# Patient Record
Sex: Male | Born: 1965 | Race: White | Hispanic: No | Marital: Married | Smoking: Never smoker
Health system: Southern US, Community
[De-identification: ages and names within clinical notes are randomized; demographics above are authoritative.]

## PROBLEM LIST (undated history)

## (undated) DIAGNOSIS — K5720 Diverticulitis of large intestine with perforation and abscess without bleeding: Secondary | ICD-10-CM

## (undated) DIAGNOSIS — J302 Other seasonal allergic rhinitis: Secondary | ICD-10-CM

## (undated) DIAGNOSIS — N189 Chronic kidney disease, unspecified: Secondary | ICD-10-CM

## (undated) DIAGNOSIS — R7303 Prediabetes: Secondary | ICD-10-CM

## (undated) DIAGNOSIS — G4730 Sleep apnea, unspecified: Secondary | ICD-10-CM

## (undated) DIAGNOSIS — K219 Gastro-esophageal reflux disease without esophagitis: Secondary | ICD-10-CM

## (undated) DIAGNOSIS — J309 Allergic rhinitis, unspecified: Secondary | ICD-10-CM

## (undated) HISTORY — PX: COLONOSCOPY: SHX174

## (undated) HISTORY — DX: Diverticulitis of large intestine with perforation and abscess without bleeding: K57.20

## (undated) HISTORY — PX: VASECTOMY: SHX75

## (undated) HISTORY — PX: NOSE SURGERY: SHX723

## (undated) HISTORY — DX: Allergic rhinitis, unspecified: J30.9

## (undated) HISTORY — DX: Chronic kidney disease, unspecified: N18.9

## (undated) HISTORY — PX: ELBOW SURGERY: SHX618

## (undated) HISTORY — PX: COLONOSCOPY W/ POLYPECTOMY: SHX1380

---

## 2013-01-16 ENCOUNTER — Encounter (HOSPITAL_COMMUNITY): Admission: EM | Disposition: A | Discharge status: Home Health | Payer: Self-pay | Source: Home / Self Care

## 2013-01-16 ENCOUNTER — Emergency Department (HOSPITAL_COMMUNITY): Payer: BC Managed Care – PPO | Admitting: Anesthesiology

## 2013-01-16 ENCOUNTER — Encounter (HOSPITAL_COMMUNITY): Payer: Self-pay | Admitting: Emergency Medicine

## 2013-01-16 ENCOUNTER — Encounter (HOSPITAL_COMMUNITY): Payer: Self-pay | Admitting: Anesthesiology

## 2013-01-16 ENCOUNTER — Emergency Department (HOSPITAL_COMMUNITY): Payer: BC Managed Care – PPO

## 2013-01-16 ENCOUNTER — Inpatient Hospital Stay (HOSPITAL_COMMUNITY)
Admission: EM | Admit: 2013-01-16 | Discharge: 2013-01-23 | DRG: 585 | Disposition: A | Discharge status: Home Health | Payer: BC Managed Care – PPO | Attending: General Surgery | Admitting: General Surgery

## 2013-01-16 DIAGNOSIS — K5732 Diverticulitis of large intestine without perforation or abscess without bleeding: Principal | ICD-10-CM | POA: Diagnosis present

## 2013-01-16 DIAGNOSIS — K659 Peritonitis, unspecified: Secondary | ICD-10-CM | POA: Diagnosis present

## 2013-01-16 DIAGNOSIS — K668 Other specified disorders of peritoneum: Secondary | ICD-10-CM

## 2013-01-16 DIAGNOSIS — K5720 Diverticulitis of large intestine with perforation and abscess without bleeding: Secondary | ICD-10-CM

## 2013-01-16 HISTORY — PX: COLOSTOMY: SHX63

## 2013-01-16 HISTORY — PX: LAPAROTOMY: SHX154

## 2013-01-16 HISTORY — DX: Diverticulitis of large intestine with perforation and abscess without bleeding: K57.20

## 2013-01-16 LAB — CBC WITH DIFFERENTIAL/PLATELET
Basophils Absolute: 0 10*3/uL (ref 0.0–0.1)
Basophils Relative: 0 % (ref 0–1)
Eosinophils Absolute: 0.1 10*3/uL (ref 0.0–0.7)
Eosinophils Relative: 1 % (ref 0–5)
HCT: 44.7 % (ref 39.0–52.0)
Hemoglobin: 14.8 g/dL (ref 13.0–17.0)
Lymphocytes Relative: 10 % — ABNORMAL LOW (ref 12–46)
Lymphs Abs: 1.3 10*3/uL (ref 0.7–4.0)
MCH: 26 pg (ref 26.0–34.0)
MCHC: 33.1 g/dL (ref 30.0–36.0)
MCV: 78.4 fL (ref 78.0–100.0)
Monocytes Absolute: 1.1 10*3/uL — ABNORMAL HIGH (ref 0.1–1.0)
Monocytes Relative: 8 % (ref 3–12)
Neutro Abs: 10.8 10*3/uL — ABNORMAL HIGH (ref 1.7–7.7)
Neutrophils Relative %: 81 % — ABNORMAL HIGH (ref 43–77)
Platelets: 183 10*3/uL (ref 150–400)
RBC: 5.7 MIL/uL (ref 4.22–5.81)
RDW: 14.7 % (ref 11.5–15.5)
WBC: 13.3 10*3/uL — ABNORMAL HIGH (ref 4.0–10.5)

## 2013-01-16 LAB — URINALYSIS, ROUTINE W REFLEX MICROSCOPIC
Bilirubin Urine: NEGATIVE
Glucose, UA: NEGATIVE mg/dL
Hgb urine dipstick: NEGATIVE
Ketones, ur: NEGATIVE mg/dL
Leukocytes, UA: NEGATIVE
Nitrite: NEGATIVE
Protein, ur: NEGATIVE mg/dL
Specific Gravity, Urine: 1.016 (ref 1.005–1.030)
Urobilinogen, UA: 0.2 mg/dL (ref 0.0–1.0)
pH: 7.5 (ref 5.0–8.0)

## 2013-01-16 LAB — COMPREHENSIVE METABOLIC PANEL
ALT: 74 U/L — ABNORMAL HIGH (ref 0–53)
AST: 54 U/L — ABNORMAL HIGH (ref 0–37)
Albumin: 3.9 g/dL (ref 3.5–5.2)
Alkaline Phosphatase: 84 U/L (ref 39–117)
BUN: 11 mg/dL (ref 6–23)
CO2: 22 mEq/L (ref 19–32)
Calcium: 8.8 mg/dL (ref 8.4–10.5)
Chloride: 99 mEq/L (ref 96–112)
Creatinine, Ser: 0.91 mg/dL (ref 0.50–1.35)
GFR calc Af Amer: >90 mL/min (ref >90)
GFR calc non Af Amer: >90 mL/min (ref >90)
Glucose, Bld: 100 mg/dL — ABNORMAL HIGH (ref 70–99)
Potassium: 4.3 mEq/L (ref 3.5–5.1)
Sodium: 134 mEq/L — ABNORMAL LOW (ref 135–145)
Total Bilirubin: 0.5 mg/dL (ref 0.3–1.2)
Total Protein: 7.6 g/dL (ref 6.0–8.3)

## 2013-01-16 LAB — LIPASE, BLOOD: Lipase: 59 U/L (ref 11–59)

## 2013-01-16 SURGERY — LAPAROTOMY, EXPLORATORY
Anesthesia: General | Site: Abdomen | Wound class: Dirty or Infected

## 2013-01-16 MED ORDER — LIDOCAINE HCL (CARDIAC) 20 MG/ML IV SOLN
INTRAVENOUS | Status: DC | PRN
  Administered 2013-01-16: 40 mg via INTRAVENOUS

## 2013-01-16 MED ORDER — PROPOFOL 10 MG/ML IV BOLUS
INTRAVENOUS | Status: DC | PRN
  Administered 2013-01-16: 200 mg via INTRAVENOUS
  Administered 2013-01-17: 50 mg via INTRAVENOUS
  Administered 2013-01-17 (×2): 30 mg via INTRAVENOUS

## 2013-01-16 MED ORDER — SUCCINYLCHOLINE CHLORIDE 20 MG/ML IJ SOLN
INTRAMUSCULAR | Status: DC | PRN
  Administered 2013-01-16: 100 mg via INTRAVENOUS

## 2013-01-16 MED ORDER — CIPROFLOXACIN IN D5W 400 MG/200ML IV SOLN: INTRAVENOUS | Status: DC | PRN

## 2013-01-16 MED ORDER — FENTANYL CITRATE 0.05 MG/ML IJ SOLN
50.0000 ug | Freq: Once | INTRAMUSCULAR | Status: AC
  Administered 2013-01-16: 50 ug via INTRAVENOUS
  Filled 2013-01-16: qty 2

## 2013-01-16 MED ORDER — 0.9 % SODIUM CHLORIDE (POUR BTL) OPTIME
TOPICAL | Status: DC | PRN
  Administered 2013-01-16: 5000 mL

## 2013-01-16 MED ORDER — SODIUM CHLORIDE 0.9 % IV BOLUS (SEPSIS)
1000.0000 mL | Freq: Once | INTRAVENOUS | Status: AC
  Administered 2013-01-16: 1000 mL via INTRAVENOUS

## 2013-01-16 MED ORDER — ONDANSETRON HCL 4 MG/2ML IJ SOLN
4.0000 mg | Freq: Once | INTRAMUSCULAR | Status: AC
  Administered 2013-01-16: 4 mg via INTRAVENOUS
  Filled 2013-01-16: qty 2

## 2013-01-16 MED ORDER — METRONIDAZOLE IN NACL 5-0.79 MG/ML-% IV SOLN
500.0000 mg | Freq: Once | INTRAVENOUS | Status: AC
  Administered 2013-01-16: 500 mg via INTRAVENOUS
  Filled 2013-01-16: qty 100

## 2013-01-16 MED ORDER — HYDROMORPHONE HCL PF 1 MG/ML IJ SOLN
1.0000 mg | Freq: Once | INTRAMUSCULAR | Status: AC
  Administered 2013-01-16: 1 mg via INTRAVENOUS
  Filled 2013-01-16: qty 1

## 2013-01-16 MED ORDER — IOHEXOL 300 MG/ML  SOLN
100.0000 mL | Freq: Once | INTRAMUSCULAR | Status: AC | PRN
  Administered 2013-01-16: 100 mL via INTRAVENOUS

## 2013-01-16 MED ORDER — SUFENTANIL CITRATE 50 MCG/ML IV SOLN
INTRAVENOUS | Status: DC | PRN
  Administered 2013-01-16 (×2): 10 ug via INTRAVENOUS
  Administered 2013-01-17: 5 ug via INTRAVENOUS
  Administered 2013-01-17 (×2): 10 ug via INTRAVENOUS
  Administered 2013-01-17 (×2): 5 ug via INTRAVENOUS
  Administered 2013-01-17: 10 ug via INTRAVENOUS

## 2013-01-16 MED ORDER — DEXAMETHASONE SODIUM PHOSPHATE 10 MG/ML IJ SOLN
INTRAMUSCULAR | Status: DC | PRN
  Administered 2013-01-16: 10 mg via INTRAVENOUS

## 2013-01-16 MED ORDER — LACTATED RINGERS IV SOLN
INTRAVENOUS | Status: DC | PRN
  Administered 2013-01-16 – 2013-01-17 (×4): via INTRAVENOUS

## 2013-01-16 MED ORDER — PHENYLEPHRINE HCL 10 MG/ML IJ SOLN
INTRAMUSCULAR | Status: DC | PRN
  Administered 2013-01-16 (×3): 80 ug via INTRAVENOUS

## 2013-01-16 MED ORDER — CIPROFLOXACIN IN D5W 400 MG/200ML IV SOLN
400.0000 mg | Freq: Once | INTRAVENOUS | Status: AC
Start: 2013-01-16 — End: 2013-01-16
  Administered 2013-01-16: 400 mg via INTRAVENOUS
  Filled 2013-01-16: qty 200

## 2013-01-16 MED ORDER — IOHEXOL 300 MG/ML  SOLN: 50.0000 mL | Freq: Once | INTRAMUSCULAR | Status: DC | PRN

## 2013-01-16 SURGICAL SUPPLY — 60 items
APL SKNCLS STERI-STRIP NONHPOA (GAUZE/BANDAGES/DRESSINGS) ×2
APPLICATOR COTTON TIP 6IN STRL (MISCELLANEOUS) ×4 IMPLANT
BANDAGE GAUZE ELAST BULKY 4 IN (GAUZE/BANDAGES/DRESSINGS) ×1 IMPLANT
BENZOIN TINCTURE PRP APPL 2/3 (GAUZE/BANDAGES/DRESSINGS) ×1 IMPLANT
BLADE EXTENDED COATED 6.5IN (ELECTRODE) ×1 IMPLANT
BLADE HEX COATED 2.75 (ELECTRODE) ×2 IMPLANT
BRR ADH 5X3 SEPRAFILM 6 SHT (MISCELLANEOUS) ×2
CANISTER SUCTION 2500CC (MISCELLANEOUS) ×2 IMPLANT
CHLORAPREP W/TINT 26ML (MISCELLANEOUS) ×3 IMPLANT
CLAMP POUCH DRAINAGE QUIET (OSTOMY) ×1 IMPLANT
CLOTH BEACON ORANGE TIMEOUT ST (SAFETY) ×3 IMPLANT
COVER MAYO STAND STRL (DRAPES) ×1 IMPLANT
DRAPE LAPAROSCOPIC ABDOMINAL (DRAPES) ×3 IMPLANT
DRAPE WARM FLUID 44X44 (DRAPE) ×1 IMPLANT
DRSG PAD ABDOMINAL 8X10 ST (GAUZE/BANDAGES/DRESSINGS) ×2 IMPLANT
ELECT REM PT RETURN 9FT ADLT (ELECTROSURGICAL) ×3
ELECTRODE REM PT RTRN 9FT ADLT (ELECTROSURGICAL) ×2 IMPLANT
GLOVE BIO SURGEON STRL SZ7 (GLOVE) ×2 IMPLANT
GLOVE BIOGEL M STRL SZ7.5 (GLOVE) ×2 IMPLANT
GLOVE BIOGEL PI IND STRL 7.0 (GLOVE) ×2 IMPLANT
GLOVE BIOGEL PI IND STRL 8 (GLOVE) IMPLANT
GLOVE BIOGEL PI INDICATOR 7.0 (GLOVE) ×1
GLOVE BIOGEL PI INDICATOR 8 (GLOVE) ×1
GLOVE SURG SS PI 7.5 STRL IVOR (GLOVE) ×6 IMPLANT
GLOVE SURG SS PI 8.5 STRL IVOR (GLOVE) ×3
GLOVE SURG SS PI 8.5 STRL STRW (GLOVE) IMPLANT
GOWN BRE IMP PREV XXLGXLNG (GOWN DISPOSABLE) ×1 IMPLANT
GOWN PREVENTION PLUS LG XLONG (DISPOSABLE) ×2 IMPLANT
GOWN STRL NON-REIN LRG LVL3 (GOWN DISPOSABLE) ×2 IMPLANT
GOWN STRL REIN XL XLG (GOWN DISPOSABLE) ×6 IMPLANT
KIT BASIN OR (CUSTOM PROCEDURE TRAY) ×3 IMPLANT
LIGASURE IMPACT 36 18CM CVD LR (INSTRUMENTS) ×1 IMPLANT
MANIFOLD NEPTUNE II (INSTRUMENTS) ×1 IMPLANT
NS IRRIG 1000ML POUR BTL (IV SOLUTION) ×9 IMPLANT
PACK GENERAL/GYN (CUSTOM PROCEDURE TRAY) ×3 IMPLANT
POUCH DRAINABLE 1PC 2 1/4 FLAT (OSTOMY) ×1 IMPLANT
SCALPEL HARMONIC ACE (MISCELLANEOUS) IMPLANT
SEPRAFILM PROCEDURAL PACK 3X5 (MISCELLANEOUS) ×1 IMPLANT
SHEARS FOC LG CVD HARMONIC 17C (MISCELLANEOUS) IMPLANT
SLEEVE SURGEON STRL (DRAPES) ×2 IMPLANT
SPONGE GAUZE 4X4 12PLY (GAUZE/BANDAGES/DRESSINGS) ×3 IMPLANT
SPONGE LAP 18X18 X RAY DECT (DISPOSABLE) ×3 IMPLANT
STAPLER CUT CVD 40MM GREEN (STAPLE) ×1 IMPLANT
STAPLER CUT RELOAD GREEN (STAPLE) ×1 IMPLANT
STAPLER VISISTAT 35W (STAPLE) ×3 IMPLANT
SUCTION POOLE TIP (SUCTIONS) ×1 IMPLANT
SUT PDS AB 1 CTX 36 (SUTURE) IMPLANT
SUT PDS AB 1 TP1 96 (SUTURE) ×2 IMPLANT
SUT PROLENE 2 0 SH DA (SUTURE) ×1 IMPLANT
SUT SILK 2 0 (SUTURE) ×3
SUT SILK 2 0 SH CR/8 (SUTURE) ×1 IMPLANT
SUT SILK 2-0 18XBRD TIE 12 (SUTURE) ×2 IMPLANT
SUT SILK 3 0 (SUTURE) ×3
SUT SILK 3 0 SH CR/8 (SUTURE) ×1 IMPLANT
SUT SILK 3-0 18XBRD TIE 12 (SUTURE) IMPLANT
SUT VIC AB 2-0 SH 18 (SUTURE) ×2 IMPLANT
TAPE CLOTH SURG 4X10 WHT LF (GAUZE/BANDAGES/DRESSINGS) ×1 IMPLANT
TOWEL OR 17X26 10 PK STRL BLUE (TOWEL DISPOSABLE) ×3 IMPLANT
TRAY FOLEY CATH 14FRSI W/METER (CATHETERS) ×1 IMPLANT
YANKAUER SUCT BULB TIP NO VENT (SUCTIONS) ×1 IMPLANT

## 2013-01-16 NOTE — ED Provider Notes (Signed)
History     CSN: [id number]  Arrival date & time 01/16/13  1657   First MD Initiated Contact with Patient 01/16/13 1818      Chief Complaint  Patient presents with  . Abdominal Pain    (Consider location/radiation/quality/duration/timing/severity/associated sxs/prior treatment) HPI Patient presents to the emergency department with sudden onset, abdominal pain, around 3:00 this afternoon.  Patient, states, that the pain is intense.  Patient denies vomiting, nausea, chest pain, shortness of breath, weakness, back pain, headache, blurred vision, dizziness, syncope, or fever.  Patient, states he did not take anything prior to arrival for his symptoms.  Palpation and movement make the pain, worse. No past medical history on file.  No past surgical history on file.  No family history on file.  History  Substance Use Topics  . Smoking status: Never Smoker   . Smokeless tobacco: Never Used  . Alcohol Use: Yes     Comment: ocassional beer/wine      Review of Systems All other systems negative except as documented in the HPI. All pertinent positives and negatives as reviewed in the HPI. Allergies  Erythromycin  Home Medications   Current Outpatient Rx  Name  Route  Sig  Dispense  Refill  . calcium carbonate (TUMS - DOSED IN MG ELEMENTAL CALCIUM) 500 MG chewable tablet   Oral   Chew 2 tablets by mouth 4 (four) times daily as needed for heartburn.         . mometasone (NASONEX) 50 MCG/ACT nasal spray   Nasal   Place 2 sprays into the nose daily.           BP 134/88  Pulse 86  Temp(Src) 98.7 F (37.1 C) (Oral)  Resp 18  SpO2 100%  Physical Exam  Constitutional: He appears well-developed and well-nourished. No distress.  HENT:  Head: Normocephalic and atraumatic.  Mouth/Throat: Oropharynx is clear and moist.  Cardiovascular: Normal rate, regular rhythm and normal heart sounds.  Exam reveals no gallop and no friction rub.   No murmur heard. Abdominal: Normal  appearance and bowel sounds are normal. There is tenderness. There is guarding.      ED Course  Procedures (including critical care time)  Labs Reviewed  CBC WITH DIFFERENTIAL - Abnormal; Notable for the following:    WBC 13.3 (*)    Neutrophils Relative 81 (*)    Lymphocytes Relative 10 (*)    Neutro Abs 10.8 (*)    Monocytes Absolute 1.1 (*)    All other components within normal limits  COMPREHENSIVE METABOLIC PANEL - Abnormal; Notable for the following:    Sodium 134 (*)    Glucose, Bld 100 (*)    AST 54 (*)    ALT 74 (*)    All other components within normal limits  URINALYSIS, ROUTINE W REFLEX MICROSCOPIC  LIPASE, BLOOD    Pain was sudden onset so initially concerned about a kidney stone but patient has not hematuria. The patient will get CT scan.  8:15 PM Called by radiology about the patient having free air and fluid. The patient remains stable. Feeling better with Dilaudid. The patient is given the results.   Spoke with Surgery and they will be in to see the patient.  MDM  MDM Reviewed: vitals and nursing note Interpretation: labs and CT scan Consults: general surgery            Carlyle Dolly, PA-C 01/16/13 2059

## 2013-01-16 NOTE — Anesthesia Preprocedure Evaluation (Addendum)
Anesthesia Evaluation  Patient identified by MRN, date of birth, ID band Patient awake    Reviewed: Allergy & Precautions, H&P , NPO status , Patient's Chart, lab work & pertinent test results  Airway Mallampati: IV TM Distance: >3 FB Neck ROM: Full  Mouth opening: Limited Mouth Opening  Dental  (+) Dental Advisory Given and Teeth Intact   Pulmonary neg pulmonary ROS,  breath sounds clear to auscultation        Cardiovascular Exercise Tolerance: Good - CAD and - Past MI negative cardio ROS  Rhythm:Regular Rate:Tachycardia     Neuro/Psych negative neurological ROS  negative psych ROS   GI/Hepatic negative GI ROS, Neg liver ROS,   Endo/Other  negative endocrine ROS  Renal/GU negative Renal ROS     Musculoskeletal negative musculoskeletal ROS (+)   Abdominal   Peds  Hematology negative hematology ROS (+)   Anesthesia Other Findings   Reproductive/Obstetrics                         Anesthesia Physical Anesthesia Plan  ASA: II and emergent  Anesthesia Plan: General   Post-op Pain Management:    Induction: Intravenous, Rapid sequence and Cricoid pressure planned  Airway Management Planned: Oral ETT and Video Laryngoscope Planned  Additional Equipment:   Intra-op Plan:   Post-operative Plan: Possible Post-op intubation/ventilation  Informed Consent: I have reviewed the patients History and Physical, chart, labs and discussed the procedure including the risks, benefits and alternatives for the proposed anesthesia with the patient or authorized representative who has indicated his/her understanding and acceptance.   Dental advisory given  Plan Discussed with: CRNA  Anesthesia Plan Comments:        Anesthesia Quick Evaluation

## 2013-01-16 NOTE — ED Notes (Signed)
Onset of acute RLQ pain at 1500 this afternoon. Started abruptly, denies N/V/D. Pt positive for rebound tenderness.

## 2013-01-16 NOTE — H&P (Signed)
Reason for Consult:diverticulitis Referring Physician: Deretha Emory PA  Dudley Major. is an 47 y.o. male.  HPI: was asked to evaluate this patient for perforated diverticulitis. This patient is normally in good health and woke up feeling fine this morning. After eating lunch today he began feeling as though he needed further restroom and was feeling "gassy". He was having some bloating and some lower abdominal pain across his belt line. He had a bowel movement which he says was normal without any blood or melena without any significant relief he said that the pain continued to increase mainly in the right lower quadrant but he was feeling this across his belt to the left lower quadrant and diffusely. He had some fever this afternoon when he checked into the emergency room but that has not had any fevers or chills. He does have frequent heartburn and takes Pepcid over-the-counter and usually takes one to 3 times per day. He says that he has never had a colonoscopy and has never had any similar episodes prior to this.  No past medical history on file.  No past surgical history on file.  No family history on file.  Social History:  reports that he has never smoked. He has never used smokeless tobacco. He reports that  drinks alcohol. He reports that he does not use illicit drugs.  Allergies:  Allergies  Allergen Reactions  . Erythromycin Other (See Comments)    Stomach upset    Medications: I have reviewed the patient's current medications.  Results for orders placed during the hospital encounter of 01/16/13 (from the past 48 hour(s))  CBC WITH DIFFERENTIAL     Status: Abnormal   Collection Time    01/16/13  6:00 PM      Result Value Range   WBC 13.3 (*) 4.0 - 10.5 K/uL   RBC 5.70  4.22 - 5.81 MIL/uL   Hemoglobin 14.8  13.0 - 17.0 g/dL   HCT 82.9  56.2 - 13.0 %   MCV 78.4  78.0 - 100.0 fL   MCH 26.0  26.0 - 34.0 pg   MCHC 33.1  30.0 - 36.0 g/dL   RDW 86.5  78.4 - 69.6 %    Platelets 183  150 - 400 K/uL   Comment: SPECIMEN CHECKED FOR CLOTS     REPEATED TO VERIFY     PLATELET COUNT CONFIRMED BY SMEAR   Neutrophils Relative 81 (*) 43 - 77 %   Lymphocytes Relative 10 (*) 12 - 46 %   Monocytes Relative 8  3 - 12 %   Eosinophils Relative 1  0 - 5 %   Basophils Relative 0  0 - 1 %   Neutro Abs 10.8 (*) 1.7 - 7.7 K/uL   Lymphs Abs 1.3  0.7 - 4.0 K/uL   Monocytes Absolute 1.1 (*) 0.1 - 1.0 K/uL   Eosinophils Absolute 0.1  0.0 - 0.7 K/uL   Basophils Absolute 0.0  0.0 - 0.1 K/uL   Smear Review LARGE PLATELETS PRESENT     Comment: PLATELET COUNT CONFIRMED BY SMEAR  COMPREHENSIVE METABOLIC PANEL     Status: Abnormal   Collection Time    01/16/13  6:00 PM      Result Value Range   Sodium 134 (*) 135 - 145 mEq/L   Potassium 4.3  3.5 - 5.1 mEq/L   Comment: MODERATE HEMOLYSIS     HEMOLYSIS AT THIS LEVEL MAY AFFECT RESULT   Chloride 99  96 - 112 mEq/L  CO2 22  19 - 32 mEq/L   Glucose, Bld 100 (*) 70 - 99 mg/dL   BUN 11  6 - 23 mg/dL   Creatinine, Ser 8.11  0.50 - 1.35 mg/dL   Calcium 8.8  8.4 - 91.4 mg/dL   Total Protein 7.6  6.0 - 8.3 g/dL   Albumin 3.9  3.5 - 5.2 g/dL   AST 54 (*) 0 - 37 U/L   ALT 74 (*) 0 - 53 U/L   Alkaline Phosphatase 84  39 - 117 U/L   Comment: MODERATE HEMOLYSIS     HEMOLYSIS AT THIS LEVEL MAY AFFECT RESULT   Total Bilirubin 0.5  0.3 - 1.2 mg/dL   GFR calc non Af Amer >90  >90 mL/min   GFR calc Af Amer >90  >90 mL/min   Comment:            The eGFR has been calculated     using the CKD EPI equation.     This calculation has not been     validated in all clinical     situations.     eGFR's persistently     <90 mL/min signify     possible Chronic Kidney Disease.  URINALYSIS, ROUTINE W REFLEX MICROSCOPIC     Status: None   Collection Time    01/16/13  6:18 PM      Result Value Range   Color, Urine YELLOW  YELLOW   APPearance CLEAR  CLEAR   Specific Gravity, Urine 1.016  1.005 - 1.030   pH 7.5  5.0 - 8.0   Glucose, UA  NEGATIVE  NEGATIVE mg/dL   Hgb urine dipstick NEGATIVE  NEGATIVE   Bilirubin Urine NEGATIVE  NEGATIVE   Ketones, ur NEGATIVE  NEGATIVE mg/dL   Protein, ur NEGATIVE  NEGATIVE mg/dL   Urobilinogen, UA 0.2  0.0 - 1.0 mg/dL   Nitrite NEGATIVE  NEGATIVE   Leukocytes, UA NEGATIVE  NEGATIVE   Comment: MICROSCOPIC NOT DONE ON URINES WITH NEGATIVE PROTEIN, BLOOD, LEUKOCYTES, NITRITE, OR GLUCOSE <1000 mg/dL.  LIPASE, BLOOD     Status: None   Collection Time    01/16/13  6:30 PM      Result Value Range   Lipase 59  11 - 59 U/L    Ct Abdomen Pelvis W Contrast  01/16/2013  *RADIOLOGY REPORT*  Clinical Data: Right lower quadrant pain and fever  CT ABDOMEN AND PELVIS WITH CONTRAST  Technique:  Multidetector CT imaging of the abdomen and pelvis was performed following the standard protocol during bolus administration of intravenous contrast.  Contrast: OMNIPAQUE IOHEXOL 300 MG/ML  SOLN  Comparison: None.  Findings: Lung bases are clear.  No pleural or pericardial fluid. There is fatty change of the liver.  No focal lesion.  No calcified gallstones.  The spleen is normal.  The pancreas is normal.  The adrenal glands are normal.  The kidneys are normal.  The aorta and IVC are normal.  There is diverticulosis of the left colon.  There is diverticulitis in the sigmoid region with bowel wall thickening and pericolic stranding.  There is a small amount of free fluid and free air.  No discernible actual abscess.  No bony abnormality.  The appendix is normal.  IMPRESSION: Acute sigmoid diverticulitis with surrounding phlegmonous inflammation and some free fluid and air.  No focal abscess at this moment.  Critical Value/emergent results were called by telephone at the time of interpretation on 01/16/2013 at 2040 hours to physician's  assistant Ebbie Ridge, who verbally acknowledged these results.   Original Report Authenticated By: Paulina Fusi, M.D.     All other review of systems negative or noncontributory except  as stated in the HPI   Blood pressure 134/88, pulse 86, temperature 98.7 F (37.1 C), temperature source Oral, resp. rate 18, SpO2 100.00%. General appearance: alert, cooperative and no distress Head: Normocephalic, without obvious abnormality, atraumatic Eyes: negative Neck: no JVD and supple, symmetrical, trachea midline Resp: nonlabored, no wheeze Chest wall: no tenderness Cardio: normal rate, regular GI: soft, diffusely tender with some guarding, tenderness greatest across the lower abdomen and RLQ Extremities: extremities normal, atraumatic, no cyanosis or edema Pulses: 2+ and symmetric Skin: Skin color, texture, turgor normal. No rashes or lesions Neurologic: Grossly normal  Assessment/Plan: Diverticulitis with perforation This patient has peritonitis on exam with evidence of free air in the sore perforation on CT scan. His white count is 13 as this is most likely consistent with a perforated diverticulitis. However, he does have heartburn and takes TUMS and Pepcid frequently if this could be peptic ulcer disease as well. I discussed with him the options for antibiotic management with bowel rest and nonoperative treatment versus laparoscopic washout and drainage versus exporter laparotomy with sigmoid colectomy and colostomy and I have recommended exploratory laparotomy with likely sigmoid colectomy and likely colostomy. I did explain that this could be other causes as well as which might require different procedure. I discussed with him the possibilities of having an open wound and meeting wound care as well as ostomy care and the need for second surgery to reverse his ostomy. We also discussed the possibility of oh anastomosis if the intestine looks healthy and with the use of risk of possible anastomotic leakage and the need for future ostomy in this case. We will go ahead and proceed with exploratory laparotomy.  Lodema Pilot DAVID 01/16/2013, 10:11 PM

## 2013-01-16 NOTE — ED Provider Notes (Signed)
Patient with right-sided abdominal pain onset approximately 1 AM today. Progressively worsening.. She feels improved since treatment in the emergency department. Plan surgical consult antibiotics  Doug Sou, MD 01/17/13 0100

## 2013-01-17 ENCOUNTER — Encounter (HOSPITAL_COMMUNITY): Payer: Self-pay | Admitting: *Deleted

## 2013-01-17 DIAGNOSIS — K631 Perforation of intestine (nontraumatic): Secondary | ICD-10-CM

## 2013-01-17 DIAGNOSIS — K5732 Diverticulitis of large intestine without perforation or abscess without bleeding: Secondary | ICD-10-CM

## 2013-01-17 LAB — CBC WITH DIFFERENTIAL/PLATELET
Basophils Absolute: 0 10*3/uL (ref 0.0–0.1)
Basophils Relative: 0 % (ref 0–1)
Eosinophils Absolute: 0 10*3/uL (ref 0.0–0.7)
Eosinophils Relative: 0 % (ref 0–5)
HCT: 37.8 % — ABNORMAL LOW (ref 39.0–52.0)
Hemoglobin: 12.2 g/dL — ABNORMAL LOW (ref 13.0–17.0)
Lymphocytes Relative: 2 % — ABNORMAL LOW (ref 12–46)
Lymphs Abs: 0.3 10*3/uL — ABNORMAL LOW (ref 0.7–4.0)
MCH: 25.4 pg — ABNORMAL LOW (ref 26.0–34.0)
MCHC: 32.3 g/dL (ref 30.0–36.0)
MCV: 78.8 fL (ref 78.0–100.0)
Monocytes Absolute: 0.7 10*3/uL (ref 0.1–1.0)
Monocytes Relative: 5 % (ref 3–12)
Neutro Abs: 13.1 10*3/uL — ABNORMAL HIGH (ref 1.7–7.7)
Neutrophils Relative %: 92 % — ABNORMAL HIGH (ref 43–77)
Platelets: 155 10*3/uL (ref 150–400)
RBC: 4.8 MIL/uL (ref 4.22–5.81)
RDW: 15 % (ref 11.5–15.5)
WBC: 14.2 10*3/uL — ABNORMAL HIGH (ref 4.0–10.5)

## 2013-01-17 LAB — BASIC METABOLIC PANEL
BUN: 10 mg/dL (ref 6–23)
CO2: 23 mEq/L (ref 19–32)
Calcium: 8 mg/dL — ABNORMAL LOW (ref 8.4–10.5)
Chloride: 98 mEq/L (ref 96–112)
Creatinine, Ser: 0.81 mg/dL (ref 0.50–1.35)
GFR calc Af Amer: >90 mL/min (ref >90)
GFR calc non Af Amer: >90 mL/min (ref >90)
Glucose, Bld: 216 mg/dL — ABNORMAL HIGH (ref 70–99)
Potassium: 4.2 mEq/L (ref 3.5–5.1)
Sodium: 132 mEq/L — ABNORMAL LOW (ref 135–145)

## 2013-01-17 MED ORDER — OXYCODONE HCL 5 MG/5ML PO SOLN
5.0000 mg | Freq: Once | ORAL | Status: DC | PRN
  Filled 2013-01-17: qty 5

## 2013-01-17 MED ORDER — DIPHENHYDRAMINE HCL 50 MG/ML IJ SOLN: 12.5000 mg | Freq: Four times a day (QID) | INTRAMUSCULAR | Status: DC | PRN

## 2013-01-17 MED ORDER — KCL IN DEXTROSE-NACL 20-5-0.45 MEQ/L-%-% IV SOLN
INTRAVENOUS | Status: DC
  Administered 2013-01-17: 05:00:00 via INTRAVENOUS
  Filled 2013-01-17 (×3): qty 1000

## 2013-01-17 MED ORDER — MORPHINE SULFATE (PF) 1 MG/ML IV SOLN: INTRAVENOUS | Status: DC

## 2013-01-17 MED ORDER — ACETAMINOPHEN 10 MG/ML IV SOLN: 1000.0000 mg | Freq: Once | INTRAVENOUS | Status: DC | PRN

## 2013-01-17 MED ORDER — METRONIDAZOLE IN NACL 5-0.79 MG/ML-% IV SOLN
500.0000 mg | Freq: Three times a day (TID) | INTRAVENOUS | Status: DC
  Administered 2013-01-17 – 2013-01-22 (×15): 500 mg via INTRAVENOUS
  Filled 2013-01-17 (×18): qty 100

## 2013-01-17 MED ORDER — DIPHENHYDRAMINE HCL 12.5 MG/5ML PO ELIX: 12.5000 mg | ORAL_SOLUTION | Freq: Four times a day (QID) | ORAL | Status: DC | PRN

## 2013-01-17 MED ORDER — HYDROMORPHONE HCL PF 1 MG/ML IJ SOLN
0.2500 mg | INTRAMUSCULAR | Status: DC | PRN
  Administered 2013-01-17 (×6): 0.5 mg via INTRAVENOUS

## 2013-01-17 MED ORDER — SODIUM CHLORIDE 0.9 % IJ SOLN: 9.0000 mL | INTRAMUSCULAR | Status: DC | PRN

## 2013-01-17 MED ORDER — CIPROFLOXACIN IN D5W 400 MG/200ML IV SOLN
400.0000 mg | Freq: Two times a day (BID) | INTRAVENOUS | Status: DC
  Administered 2013-01-17 – 2013-01-21 (×10): 400 mg via INTRAVENOUS
  Filled 2013-01-17 (×11): qty 200

## 2013-01-17 MED ORDER — HYDROMORPHONE HCL PF 1 MG/ML IJ SOLN
INTRAMUSCULAR | Status: DC | PRN
  Administered 2013-01-17: 1 mg via INTRAVENOUS

## 2013-01-17 MED ORDER — ONDANSETRON HCL 4 MG PO TABS: 4.0000 mg | ORAL_TABLET | Freq: Four times a day (QID) | ORAL | Status: DC | PRN

## 2013-01-17 MED ORDER — ROCURONIUM BROMIDE 100 MG/10ML IV SOLN
INTRAVENOUS | Status: DC | PRN
  Administered 2013-01-16: 45 mg via INTRAVENOUS
  Administered 2013-01-16: 5 mg via INTRAVENOUS
  Administered 2013-01-17 (×3): 10 mg via INTRAVENOUS

## 2013-01-17 MED ORDER — NALOXONE HCL 0.4 MG/ML IJ SOLN: 0.4000 mg | INTRAMUSCULAR | Status: DC | PRN

## 2013-01-17 MED ORDER — MORPHINE SULFATE (PF) 1 MG/ML IV SOLN
INTRAVENOUS | Status: DC
  Administered 2013-01-17: 25 mg via INTRAVENOUS
  Administered 2013-01-17: 12 mg via INTRAVENOUS
  Administered 2013-01-17: 03:00:00 via INTRAVENOUS
  Filled 2013-01-17: qty 25

## 2013-01-17 MED ORDER — ONDANSETRON HCL 4 MG/2ML IJ SOLN
INTRAMUSCULAR | Status: DC | PRN
  Administered 2013-01-17: 4 mg via INTRAVENOUS

## 2013-01-17 MED ORDER — ONDANSETRON HCL 4 MG/2ML IJ SOLN
4.0000 mg | Freq: Four times a day (QID) | INTRAMUSCULAR | Status: DC | PRN
  Administered 2013-01-17 (×2): 4 mg via INTRAVENOUS
  Filled 2013-01-17 (×4): qty 2

## 2013-01-17 MED ORDER — GLYCOPYRROLATE 0.2 MG/ML IJ SOLN
INTRAMUSCULAR | Status: DC | PRN
  Administered 2013-01-17: 0.4 mg via INTRAVENOUS

## 2013-01-17 MED ORDER — BIOTENE DRY MOUTH MT LIQD
15.0000 mL | Freq: Two times a day (BID) | OROMUCOSAL | Status: DC
  Administered 2013-01-18 – 2013-01-21 (×7): 15 mL via OROMUCOSAL

## 2013-01-17 MED ORDER — ONDANSETRON HCL 4 MG/2ML IJ SOLN: 4.0000 mg | Freq: Four times a day (QID) | INTRAMUSCULAR | Status: DC | PRN

## 2013-01-17 MED ORDER — HYDROMORPHONE HCL PF 1 MG/ML IJ SOLN
INTRAMUSCULAR | Status: AC
  Administered 2013-01-17: 1 mg
  Filled 2013-01-17: qty 1

## 2013-01-17 MED ORDER — MORPHINE SULFATE (PF) 1 MG/ML IV SOLN
INTRAVENOUS | Status: AC
  Filled 2013-01-17: qty 25

## 2013-01-17 MED ORDER — HYDROMORPHONE HCL PF 1 MG/ML IJ SOLN
INTRAMUSCULAR | Status: AC
  Filled 2013-01-17: qty 1

## 2013-01-17 MED ORDER — NEOSTIGMINE METHYLSULFATE 1 MG/ML IJ SOLN
INTRAMUSCULAR | Status: DC | PRN
  Administered 2013-01-17: 3 mg via INTRAVENOUS

## 2013-01-17 MED ORDER — MEPERIDINE HCL 50 MG/ML IJ SOLN: 6.2500 mg | INTRAMUSCULAR | Status: DC | PRN

## 2013-01-17 MED ORDER — MORPHINE SULFATE (PF) 1 MG/ML IV SOLN
INTRAVENOUS | Status: DC
  Administered 2013-01-17: 6.7 mg via INTRAVENOUS
  Administered 2013-01-18: 25 mg via INTRAVENOUS
  Administered 2013-01-18: 0.398 mg via INTRAVENOUS
  Administered 2013-01-18: 8 mg via INTRAVENOUS
  Administered 2013-01-18: 11.13 mg via INTRAVENOUS
  Administered 2013-01-19: 5.47 mg via INTRAVENOUS
  Administered 2013-01-19: 5 mg via INTRAVENOUS
  Administered 2013-01-19: 02:00:00 via INTRAVENOUS
  Filled 2013-01-17 (×3): qty 25

## 2013-01-17 MED ORDER — OXYCODONE HCL 5 MG PO TABS: 5.0000 mg | ORAL_TABLET | Freq: Once | ORAL | Status: DC | PRN

## 2013-01-17 MED ORDER — ONDANSETRON HCL 4 MG/2ML IJ SOLN
4.0000 mg | Freq: Four times a day (QID) | INTRAMUSCULAR | Status: DC | PRN
  Administered 2013-01-18: 4 mg via INTRAVENOUS

## 2013-01-17 MED ORDER — CHLORHEXIDINE GLUCONATE 0.12 % MT SOLN
15.0000 mL | Freq: Two times a day (BID) | OROMUCOSAL | Status: DC
  Administered 2013-01-17 – 2013-01-22 (×8): 15 mL via OROMUCOSAL
  Filled 2013-01-17 (×15): qty 15

## 2013-01-17 MED ORDER — PROMETHAZINE HCL 25 MG/ML IJ SOLN
25.0000 mg | Freq: Once | INTRAMUSCULAR | Status: AC | PRN
  Filled 2013-01-17: qty 1

## 2013-01-17 MED ORDER — MIDAZOLAM HCL 5 MG/5ML IJ SOLN
INTRAMUSCULAR | Status: DC | PRN
  Administered 2013-01-16: 2 mg via INTRAVENOUS

## 2013-01-17 MED ORDER — ONDANSETRON HCL 4 MG/2ML IJ SOLN
4.0000 mg | Freq: Four times a day (QID) | INTRAMUSCULAR | Status: DC | PRN
  Administered 2013-01-20: 4 mg via INTRAVENOUS

## 2013-01-17 MED ORDER — KETOROLAC TROMETHAMINE 30 MG/ML IJ SOLN
30.0000 mg | Freq: Four times a day (QID) | INTRAMUSCULAR | Status: AC | PRN
  Administered 2013-01-17: 30 mg via INTRAVENOUS
  Filled 2013-01-17: qty 1

## 2013-01-17 MED ORDER — PROMETHAZINE HCL 25 MG/ML IJ SOLN: 6.2500 mg | INTRAMUSCULAR | Status: DC | PRN

## 2013-01-17 MED ORDER — ENOXAPARIN SODIUM 40 MG/0.4ML ~~LOC~~ SOLN
40.0000 mg | SUBCUTANEOUS | Status: DC
  Administered 2013-01-18 – 2013-01-22 (×5): 40 mg via SUBCUTANEOUS
  Filled 2013-01-17 (×6): qty 0.4

## 2013-01-17 MED ORDER — KCL IN DEXTROSE-NACL 20-5-0.9 MEQ/L-%-% IV SOLN
INTRAVENOUS | Status: DC
  Administered 2013-01-17 – 2013-01-18 (×4): via INTRAVENOUS
  Filled 2013-01-17 (×7): qty 1000

## 2013-01-17 MED ORDER — LABETALOL HCL 5 MG/ML IV SOLN
INTRAVENOUS | Status: AC
  Administered 2013-01-17: 5 mg
  Filled 2013-01-17: qty 4

## 2013-01-17 NOTE — ED Provider Notes (Signed)
Medical screening examination/treatment/procedure(s) were conducted as a shared visit with non-physician practitioner(s) and myself.  I personally evaluated the patient during the encounter  Doug Sou, MD 01/17/13 [phone]

## 2013-01-17 NOTE — Transfer of Care (Signed)
Immediate Anesthesia Transfer of Care Note  Patient: Jeremiah Jefferson.  Procedure(s) Performed: Procedure(s) with comments: EXPLORATORY LAPAROTOMY (N/A) - SIGMOID  COLECTOMY COLOSTOMY (Left)  Patient Location: PACU  Anesthesia Type:General  Level of Consciousness: awake, sedated and patient cooperative  Airway & Oxygen Therapy: Patient Spontanous Breathing and Patient connected to face mask oxygen  Post-op Assessment: Report given to PACU RN, Post -op Vital signs reviewed and stable and Patient moving all extremities X 4  Post vital signs: stable  Complications: No apparent anesthesia complications

## 2013-01-17 NOTE — Consult Note (Signed)
WOC ostomy consult  Stoma type/location: LLQ end Colostomy (temporary) Stomal assessment/size: stoma measures 1 and 5/8 inches, but is not exactly round. Moist, red, viable, non-functioning Peristomal assessment: intact, clear Treatment options for stomal/peristomal skin: None indicated Output: None at this time.  Scant amount of bloody effluent in old pouch. Ostomy pouching: 1pc. Applied in surgery and 1-piece applied today following assessment.  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  I think that perhaps a 2-piece pouching system with the capability to interchange closed end pouches with drainable pouches may be the most practical in his recovery and thereafter.  Education provided: Patient just had surgery today.  Is groggy and medicated-he used his PCA pump during my visit this evening.  I have provided an educational booklet to the bedside. He understands that there ae ostomy nurses here who will assist him in the immediate post op period and that there will likely be a HHRN to assist in the immediate post acute care period. Our team will follow along with you. Thanks, Ladona Mow, MSN, RN, [facility], CWOCN [phone])

## 2013-01-17 NOTE — Progress Notes (Signed)
UR completed 

## 2013-01-17 NOTE — Progress Notes (Addendum)
1 Day Post-Op  Subjective: C/o nausea despite NG tube.  Minimal NG output.  Pain control and oversedation an issue. He complains of pain but seems to get too drowsy with pain meds.  Objective: Vital signs in last 24 hours: Temp:  [97.5 F (36.4 C)-98.7 F (37.1 C)] 97.8 F (36.6 C) (02/23 0658) Pulse Rate:  [76-99] 89 (02/23 0658) Resp:  [12-24] 24 (02/23 0658) BP: (119-161)/(70-125) 128/77 mmHg (02/23 0658) SpO2:  [91 %-100 %] 96 % (02/23 0658) Weight:  [162 lb (73.483 kg)] 162 lb (73.483 kg) (02/23 0404)    Intake/Output from previous day: 02/22 0701 - 02/23 0700 In: 3672.9 [I.V.:3672.9] Out: 1525 [Urine:1425; Blood:100] Intake/Output this shift:    General appearance: cooperative, no distress and sleepy and sedated Resp: nonlabored Cardio: normal rate (72), regular GI: soft, appropriate tenderness, ND, wound packed no sign of infection, ostomy looks pink, no output Extremities: SCD's bilat. LE  Lab Results:   Recent Labs  01/16/13 1800  WBC 13.3*  HGB 14.8  HCT 44.7  PLT 183   BMET  Recent Labs  01/16/13 1800  NA 134*  K 4.3  CL 99  CO2 22  GLUCOSE 100*  BUN 11  CREATININE 0.91  CALCIUM 8.8   PT/INR No results found for this basename: LABPROT, INR,  in the last 72 hours ABG No results found for this basename: PHART, PCO2, PO2, HCO3,  in the last 72 hours  Studies/Results: Ct Abdomen Pelvis W Contrast  01/16/2013  *RADIOLOGY REPORT*  Clinical Data: Right lower quadrant pain and fever  CT ABDOMEN AND PELVIS WITH CONTRAST  Technique:  Multidetector CT imaging of the abdomen and pelvis was performed following the standard protocol during bolus administration of intravenous contrast.  Contrast: OMNIPAQUE IOHEXOL 300 MG/ML  SOLN  Comparison: None.  Findings: Lung bases are clear.  No pleural or pericardial fluid. There is fatty change of the liver.  No focal lesion.  No calcified gallstones.  The spleen is normal.  The pancreas is normal.  The adrenal  glands are normal.  The kidneys are normal.  The aorta and IVC are normal.  There is diverticulosis of the left colon.  There is diverticulitis in the sigmoid region with bowel wall thickening and pericolic stranding.  There is a small amount of free fluid and free air.  No discernible actual abscess.  No bony abnormality.  The appendix is normal.  IMPRESSION: Acute sigmoid diverticulitis with surrounding phlegmonous inflammation and some free fluid and air.  No focal abscess at this moment.  Critical Value/emergent results were called by telephone at the time of interpretation on 01/16/2013 at 2040 hours to physician's assistant Ebbie Ridge, who verbally acknowledged these results.   Original Report Authenticated By: Paulina Fusi, M.D.     Anti-infectives: Anti-infectives   Start     Dose/Rate Route Frequency Ordered Stop   01/17/13 1000  ciprofloxacin (CIPRO) IVPB 400 mg     400 mg 200 mL/hr over 60 Minutes Intravenous Every 12 hours 01/17/13 0404     01/17/13 0600  metroNIDAZOLE (FLAGYL) IVPB 500 mg     500 mg 100 mL/hr over 60 Minutes Intravenous Every 8 hours 01/17/13 0404     01/16/13 2045  ciprofloxacin (CIPRO) IVPB 400 mg     400 mg 200 mL/hr over 60 Minutes Intravenous  Once 01/16/13 2044 01/16/13 2300   01/16/13 2045  metroNIDAZOLE (FLAGYL) IVPB 500 mg     500 mg 100 mL/hr over 60 Minutes Intravenous  Once 01/16/13 2044 01/16/13 2207      Assessment/Plan: s/p Procedure(s) with comments: EXPLORATORY LAPAROTOMY (N/A) - SIGMOID  COLECTOMY COLOSTOMY (Left) will add toradol for pain control and modify PCA.  In chair today.  Awaiting return of bowel function, wound care  LOS: 1 day    Jeremiah Jefferson 01/17/2013 He is still POD 0 from his procedure.  Will plan on removing foley tomorrow on POD 1 as per hospital protocol. He would likely be too uncomfortable at this point for removal.

## 2013-01-17 NOTE — Op Note (Signed)
NAMEDEAVEON, SCHOEN NO.:  [id number]  MEDICAL RECORD NO.:  [id number]  LOCATION:  [facility]                         FACILITY:  [facility]  PHYSICIAN:  Lodema Pilot, MD       DATE OF BIRTH:  12-28-1965  DATE OF PROCEDURE:  01/17/2013 DATE OF DISCHARGE:                              OPERATIVE REPORT   PROCEDURE:  Exploratory laparotomy with sigmoid colectomy and end colostomy.  PREOPERATIVE DIAGNOSIS:  Perforated diverticulitis.  POSTOPERATIVE DIAGNOSIS:  Perforated diverticulitis.  SURGEON:  Lodema Pilot, MD  ASSISTANT:  Dr. Andrey Campanile.  ANESTHESIA:  General endotracheal anesthesia.  FLUIDS:  3200 mL of crystalloid.  ESTIMATED BLOOD LOSS:  100 mL.  DRAINS:  None.  SPECIMENS:  Sigmoid colon with the suture marking proximal and sent to Pathology for permanent sectioning.  COMPLICATIONS:  None apparent.  FINDINGS:  Acute perforated sigmoid diverticulitis with free purulence throughout the abdomen and thickening and inflammatory changes of the sigmoid colon and mesentery.  Prolene sutures marking the rectal pouch and end colostomy.  INDICATIONS FOR PROCEDURE:  Mr. Roper is a 47 year old male with acute onset of lower abdominal discomfort beginning earlier today with progression of his pain which led him to the emergency room for evaluation.  He had a peritonitis on exam and a white blood cell count of 14,000 as well as CT scan concerning for acute perforated sigmoid diverticulitis.  OPERATIVE DETAILS:  Mr. Tipps was seen and evaluated in the preoperative area and risks and benefits were seen in the emergency room.  The nonsurgical and surgical options were discussed with the patient.  Informed consent was obtained and he was given therapeutic antibiotics and taken to the operative room and placed on the table in supine position.  General endotracheal anesthesia was obtained and Foley catheter was placed.  His abdomen was prepped and draped in a  standard surgical fashion.  An NG tube was placed.  Procedure time-out was performed with all operative team members to confirm proper patient and procedure.  A lower midline incision was made in the skin and dissection carried down through the subcutaneous tissue using Bovie electrocautery. The fascia was incised in the midline and the peritoneum was entered and self retaining retractor was placed.  He had purulent fluid throughout his abdomen consistent with a perforated intestines.  He did not have fecal contamination.  I explored his abdomen and it was clear that the source of the contamination was from a thickened segment of sigmoid colon containing multiple diverticulitis consistent with acute perforated sigmoid diverticulitis.  I mobilized the sigmoid colon along the white line of Toldt.  I then elevated up and irrigated this up the left colon towards the spleen in order to mobilize the sigmoid colon and created a window through the mesocolon proximal to the inflammatory change and a notable segment of intestines.  I divided the colon with the Contour staplers.  I scored the peritoneum along the sigmoid colon and then undermined the mesentery of the resected bowel with LigaSure device staying high close to the sigmoid colon to minimize chance of injury to the retroperitoneal structures such as the ureter.  It was carried down towards the rectum.  It carried the dissection below all visible diverticulum.  On the rectum, I created a window through the medial rectus and divided this portion of large intestine with another firing of the green Contour stapler.  The final segment of mesorectum was divided with LigaSure and a suture was placed on the proximal sigmoid colon, and it was sent to Pathology for permanent section.  The figure-of-eight 2-0 silk suture was placed in the mesentery in the area of the vascular pedicle, and two 2-0 Prolene sutures were placed on the corners of the  rectal stump for later identification.  I scored the mesocolon along the left colon and certified that we had enough colon to reach the abdominal wall to create a diverting ostomy.  We irrigated the abdomen with sterile saline solution until the irrigation returned clear and inspected the abdomen for hemostasis, which was noted to be adequate.  A site was selected just above the umbilicus in the left lateral abdomen through the left rectus muscle and the circular skin incision was made down to the fascia and the anterior and posterior rectus fascia were incised with cruciate incision.  The end of the sigmoid colon was brought up to the abdominal wall intact to the anterior fascia with a 2-0 Vicryl suture and the midline fascia was then approximated with #1 looped PDS x2 taking care to avoid injury to underlying bowel contents.  The staple lines in the colostomy was removed and the ostomy was matured.  Four sutures were placed at the 12 o'clock, 3 o'clock, 6 o'clock, and 9 o'clock positions of the ostomy through the dermis, the bowel wall and then full-thickness through the end of the bowel wall in order to try to evert the ostomy in a Brooke fashion.  The sutures were secured and then several 2-0 Prolene sutures were placed through the dermis and full-thickness through the colon in order to mature the ostomy circumferentially around the ostomy.  The skin was then washed and dried.  An ostomy appliance was cut to fit the wound and then applied.  The midline wound was irrigated and the skin was approximated at the umbilicus with the remainder of the wound was packed open with moist Kerlix gauze.  NG tube was left in place and Foley catheter was left in place.  All sponge, needle, and instrument counts correct at the end of the case.  The patient tolerated the procedure well without apparent complications.          ______________________________ Lodema Pilot, MD     BL/MEDQ  D:   01/17/2013  T:  01/17/2013  Job:  [id number]

## 2013-01-17 NOTE — Progress Notes (Signed)
Pt self administered 25.22 mg of morphine pca by 0800, 2.3 mg by 1200, and 2.0 mg at 1600.

## 2013-01-17 NOTE — Anesthesia Postprocedure Evaluation (Signed)
Anesthesia Post Note  Patient: Jeremiah Jefferson.  Procedure(s) Performed: Procedure(s) (LRB): EXPLORATORY LAPAROTOMY (N/A) COLOSTOMY (Left)  Anesthesia type: General  Patient location: PACU  Post pain: Pain level controlled  Post assessment: Post-op Vital signs reviewed  Last Vitals: BP 131/96  Pulse 94  Temp(Src) 36.7 C (Oral)  Resp 18  SpO2 100%  Post vital signs: Reviewed  Level of consciousness: sedated  Complications: No apparent anesthesia complications

## 2013-01-17 NOTE — Progress Notes (Signed)
MD called regarding dressing changes for pt. MD ordered to let MD change dressing in am.  Jeremiah Jefferson

## 2013-01-17 NOTE — Brief Op Note (Signed)
01/16/2013 - 01/17/2013  2:13 AM  PATIENT:  Jeremiah Jefferson.  47 y.o. male  PRE-OPERATIVE DIAGNOSIS:  bowel perforation   POST-OPERATIVE DIAGNOSIS:  perforated diverticulitis  PROCEDURE:  Procedure(s) with comments: EXPLORATORY LAPAROTOMY (N/A) - SIGMOID  COLECTOMY COLOSTOMY (Left)  SURGEON:  Surgeon(s) and Role:    * Atilano Ina, MD,FACS - Assisting    * Lodema Pilot, DO - Primary  PHYSICIAN ASSISTANT:   ASSISTANTS: Wilson   ANESTHESIA:   general  EBL:  Total I/O In: 3500 [I.V.:3500] Out: 1175 [Urine:1075; Blood:100]  BLOOD ADMINISTERED:none  DRAINS: none   LOCAL MEDICATIONS USED:  NONE  SPECIMEN:  Source of Specimen:  sigmoid colon, suture proximal  DISPOSITION OF SPECIMEN:  PATHOLOGY  COUNTS:  YES  TOURNIQUET:  * No tourniquets in log *  DICTATION: .Other Dictation: Dictation Number [id number]  PLAN OF CARE: Admit to inpatient   PATIENT DISPOSITION:  PACU - hemodynamically stable.   Delay start of Pharmacological VTE agent (>24hrs) due to surgical blood loss or risk of bleeding: no

## 2013-01-18 ENCOUNTER — Encounter (HOSPITAL_COMMUNITY): Payer: Self-pay | Admitting: General Surgery

## 2013-01-18 DIAGNOSIS — K5720 Diverticulitis of large intestine with perforation and abscess without bleeding: Secondary | ICD-10-CM | POA: Diagnosis present

## 2013-01-18 LAB — CBC
HCT: 35.9 % — ABNORMAL LOW (ref 39.0–52.0)
Hemoglobin: 11.3 g/dL — ABNORMAL LOW (ref 13.0–17.0)
MCH: 24.9 pg — ABNORMAL LOW (ref 26.0–34.0)
MCHC: 31.5 g/dL (ref 30.0–36.0)
MCV: 79.1 fL (ref 78.0–100.0)
Platelets: 134 10*3/uL — ABNORMAL LOW (ref 150–400)
RBC: 4.54 MIL/uL (ref 4.22–5.81)
RDW: 15.3 % (ref 11.5–15.5)
WBC: 13 10*3/uL — ABNORMAL HIGH (ref 4.0–10.5)

## 2013-01-18 LAB — BASIC METABOLIC PANEL
BUN: 12 mg/dL (ref 6–23)
CO2: 27 mEq/L (ref 19–32)
Calcium: 8.2 mg/dL — ABNORMAL LOW (ref 8.4–10.5)
Chloride: 103 mEq/L (ref 96–112)
Creatinine, Ser: 0.89 mg/dL (ref 0.50–1.35)
GFR calc Af Amer: >90 mL/min (ref >90)
GFR calc non Af Amer: >90 mL/min (ref >90)
Glucose, Bld: 160 mg/dL — ABNORMAL HIGH (ref 70–99)
Potassium: 5.1 mEq/L (ref 3.5–5.1)
Sodium: 136 mEq/L (ref 135–145)

## 2013-01-18 NOTE — Progress Notes (Signed)
Agree with A&P of WJ,PA. He seems to be progressing as expected

## 2013-01-18 NOTE — Progress Notes (Signed)
2 Days Post-Op  Subjective: Very sore and tender.  No flatus, has been up to the chair but no further.    Objective: Vital signs in last 24 hours: Temp:  [97.4 F (36.3 C)-98.1 F (36.7 C)] 97.7 F (36.5 C) (02/24 0530) Pulse Rate:  [61-83] 83 (02/24 0530) Resp:  [14-28] 20 (02/24 0530) BP: (104-125)/(69-84) 125/84 mmHg (02/24 0530) SpO2:  [9 %-99 %] 99 % (02/24 0530)   Stool 10 ML recorded. NG 400 recorded, afebrile, VSS,  Intake/Output from previous day: 02/23 0701 - 02/24 0700 In: 3175 [I.V.:2775; IV Piggyback:400] Out: 3135 [Urine:2725; Emesis/NG output:400; Stool:10] Intake/Output this shift:    General appearance: alert, cooperative and no distress Resp: clear to auscultation bilaterally GI: soft, very tender, few BS, No flatus or BM thru the ostomy.  Lab Results:   Recent Labs  01/17/13 0828 01/18/13 0400  WBC 14.2* 13.0*  HGB 12.2* 11.3*  HCT 37.8* 35.9*  PLT 155 134*    BMET  Recent Labs  01/17/13 0828 01/18/13 0400  NA 132* 136  K 4.2 5.1  CL 98 103  CO2 23 27  GLUCOSE 216* 160*  BUN 10 12  CREATININE 0.81 0.89  CALCIUM 8.0* 8.2*   PT/INR No results found for this basename: LABPROT, INR,  in the last 72 hours   Recent Labs Lab 01/16/13 1800  AST 54*  ALT 74*  ALKPHOS 84  BILITOT 0.5  PROT 7.6  ALBUMIN 3.9     Lipase     Component Value Date/Time   LIPASE 59 01/16/2013 1830     Studies/Results: Ct Abdomen Pelvis W Contrast  01/16/2013  *RADIOLOGY REPORT*  Clinical Data: Right lower quadrant pain and fever  CT ABDOMEN AND PELVIS WITH CONTRAST  Technique:  Multidetector CT imaging of the abdomen and pelvis was performed following the standard protocol during bolus administration of intravenous contrast.  Contrast: OMNIPAQUE IOHEXOL 300 MG/ML  SOLN  Comparison: None.  Findings: Lung bases are clear.  No pleural or pericardial fluid. There is fatty change of the liver.  No focal lesion.  No calcified gallstones.  The spleen is  normal.  The pancreas is normal.  The adrenal glands are normal.  The kidneys are normal.  The aorta and IVC are normal.  There is diverticulosis of the left colon.  There is diverticulitis in the sigmoid region with bowel wall thickening and pericolic stranding.  There is a small amount of free fluid and free air.  No discernible actual abscess.  No bony abnormality.  The appendix is normal.  IMPRESSION: Acute sigmoid diverticulitis with surrounding phlegmonous inflammation and some free fluid and air.  No focal abscess at this moment.  Critical Value/emergent results were called by telephone at the time of interpretation on 01/16/2013 at 2040 hours to physician's assistant Jeremiah Jefferson, who verbally acknowledged these results.   Original Report Authenticated By: Jeremiah Jefferson, M.D.     Medications: . antiseptic oral rinse  15 mL Mouth Rinse q12n4p  . chlorhexidine  15 mL Mouth Rinse BID  . ciprofloxacin  400 mg Intravenous Q12H  . enoxaparin  40 mg Subcutaneous Q24H  . metronidazole  500 mg Intravenous Q8H  . morphine   Intravenous Q4H   . dextrose 5 % and 0.9 % NaCl with KCl 20 mEq/L 125 mL/hr at 01/18/13 0141   Prior to Admission medications   Medication Sig Start Date End Date Taking? Authorizing Provider  calcium carbonate (TUMS - DOSED IN MG ELEMENTAL CALCIUM)  500 MG chewable tablet Chew 2 tablets by mouth 4 (four) times daily as needed for heartburn.   Yes Historical Provider, MD  mometasone (NASONEX) 50 MCG/ACT nasal spray Place 2 sprays into the nose daily.   Yes Historical Provider, MD     Assessment/Plan Perforated diverticulitis S/p Exploratory laparotomy with sigmoid colectomy and end  Colostomy. 01/17/2013, Jeremiah Pilot, Jeremiah Jefferson  Plan:  Mobilize, start BID dressing changes, wound looks great today.  Continue IS, leave NG for now.  He's just on ice chips.       LOS: 2 days    Jeremiah Jefferson 01/18/2013

## 2013-01-19 LAB — CBC
HCT: 36.4 % — ABNORMAL LOW (ref 39.0–52.0)
Hemoglobin: 11.7 g/dL — ABNORMAL LOW (ref 13.0–17.0)
MCH: 25.6 pg — ABNORMAL LOW (ref 26.0–34.0)
MCHC: 32.1 g/dL (ref 30.0–36.0)
MCV: 79.6 fL (ref 78.0–100.0)
Platelets: 144 10*3/uL — ABNORMAL LOW (ref 150–400)
RBC: 4.57 MIL/uL (ref 4.22–5.81)
RDW: 15.4 % (ref 11.5–15.5)
WBC: 8.8 10*3/uL (ref 4.0–10.5)

## 2013-01-19 LAB — BASIC METABOLIC PANEL
BUN: 11 mg/dL (ref 6–23)
CO2: 28 mEq/L (ref 19–32)
Calcium: 8.4 mg/dL (ref 8.4–10.5)
Chloride: 101 mEq/L (ref 96–112)
Creatinine, Ser: 0.91 mg/dL (ref 0.50–1.35)
GFR calc Af Amer: >90 mL/min (ref >90)
GFR calc non Af Amer: >90 mL/min (ref >90)
Glucose, Bld: 121 mg/dL — ABNORMAL HIGH (ref 70–99)
Potassium: 3.4 mEq/L — ABNORMAL LOW (ref 3.5–5.1)
Sodium: 135 mEq/L (ref 135–145)

## 2013-01-19 LAB — MAGNESIUM: Magnesium: 1.9 mg/dL (ref 1.5–2.5)

## 2013-01-19 MED ORDER — ACETAMINOPHEN 10 MG/ML IV SOLN
1000.0000 mg | Freq: Four times a day (QID) | INTRAVENOUS | Status: AC
  Administered 2013-01-19 – 2013-01-20 (×4): 1000 mg via INTRAVENOUS
  Filled 2013-01-19 (×7): qty 100

## 2013-01-19 MED ORDER — FLUTICASONE PROPIONATE 50 MCG/ACT NA SUSP
2.0000 | Freq: Every day | NASAL | Status: DC
  Administered 2013-01-19 – 2013-01-23 (×5): 2 via NASAL
  Filled 2013-01-19: qty 16

## 2013-01-19 MED ORDER — KCL IN DEXTROSE-NACL 40-5-0.9 MEQ/L-%-% IV SOLN
INTRAVENOUS | Status: DC
  Administered 2013-01-19 – 2013-01-20 (×2): via INTRAVENOUS
  Administered 2013-01-20: 75 mL/h via INTRAVENOUS
  Administered 2013-01-20 – 2013-01-21 (×2): via INTRAVENOUS
  Filled 2013-01-19 (×8): qty 1000

## 2013-01-19 NOTE — Progress Notes (Signed)
Agree with A&P of WJ, PA. He seems to be progressing as expected. Discussed timing of reversal etc with him

## 2013-01-19 NOTE — Care Management Note (Addendum)
    Page 1 of 2   01/22/2013     1:08:53 PM   CARE MANAGEMENT NOTE 01/22/2013  Patient:  Jeremiah Jefferson, Jeremiah Jefferson   Account Number:  [id number]  Date Initiated:  01/17/2013  Documentation initiated by:  [facility]  Subjective/Objective Assessment:   47 year old male admitted s/p exp lap with sigmoid colectomy and colostomy.     Action/Plan:   Independent PTA.   Anticipated DC Date:  01/23/2013   Anticipated DC Plan:  HOME W HOME HEALTH SERVICES      DC Planning Services  CM consult      [facility] Choice  HOME HEALTH   Choice offered to / List presented to:  C-1 Patient        HH arranged  HH-1 RN      Status of service:  Completed, signed off Medicare Important Message given?  NA - LOS <3 / Initial given by admissions (If response is "NO", the following Medicare IM given date fields will be blank) Date Medicare IM given:   Date Additional Medicare IM given:    Discharge Disposition:  HOME W HOME HEALTH SERVICES  Per UR Regulation:  Reviewed for med. necessity/level of care/duration of stay  If discussed at Long Length of Stay Meetings, dates discussed:    Comments:  01-22-13 Lorenda Ishihara RN CM 1000 Spoke with patient at bedside, he wanted to use [facility] for [facility] services. Contacted them and they do not provide skilled services such as RN. Patient then chose [facility], spoke with Debbie form [place] and she will follow the patient for d/c needs. Awaiting final orders.  01-20-12 Lorenda Ishihara RN CM 1000 Spoke with patient at bedside. Did not want to discuss HH at this time, continues with NG at present. Left list of [facility] agencies for choice for patient to look over. Patient will discuss with spouse and decide. Will f/u tomorrow.

## 2013-01-19 NOTE — Progress Notes (Signed)
3 Days Post-Op  Subjective: Up trying to walk, he's very sore.  Having back pain which he's had problems with before.  Nothing thru the Ostomy so far.  Objective: Vital signs in last 24 hours: Temp:  [97.7 F (36.5 C)-98.6 F (37 C)] 98.1 F (36.7 C) (02/25 0659) Pulse Rate:  [81-86] 86 (02/25 0659) Resp:  [17-20] 17 (02/25 0755) BP: (138-149)/(87-91) 149/87 mmHg (02/25 0659) SpO2:  [98 %-100 %] 100 % (02/25 0755)   NG 175, no stool from colostomy recorded, afebrile, BP up some, K+ is low. Intake/Output from previous day: 02/24 0701 - 02/25 0700 In: 4341.2 [I.V.:3211.2; NG/GT:30; IV Piggyback:1100] Out: 3925 [Urine:3750; Emesis/NG output:175] Intake/Output this shift: Total I/O In: -  Out: 450 [Urine:450]  General appearance: alert, cooperative and no distress Resp: clear to auscultation bilaterally GI: soft, very tender, wounds are clean and look good, nothing in the ostomy bag, no gas or stool, just some clear liquid.  Lab Results:   Recent Labs  01/18/13 0400 01/19/13 0415  WBC 13.0* 8.8  HGB 11.3* 11.7*  HCT 35.9* 36.4*  PLT 134* 144*    BMET  Recent Labs  01/18/13 0400 01/19/13 0415  NA 136 135  K 5.1 3.4*  CL 103 101  CO2 27 28  GLUCOSE 160* 121*  BUN 12 11  CREATININE 0.89 0.91  CALCIUM 8.2* 8.4   PT/INR No results found for this basename: LABPROT, INR,  in the last 72 hours   Recent Labs Lab 01/16/13 1800  AST 54*  ALT 74*  ALKPHOS 84  BILITOT 0.5  PROT 7.6  ALBUMIN 3.9     Lipase     Component Value Date/Time   LIPASE 59 01/16/2013 1830     Studies/Results: No results found.  Medications: . antiseptic oral rinse  15 mL Mouth Rinse q12n4p  . chlorhexidine  15 mL Mouth Rinse BID  . ciprofloxacin  400 mg Intravenous Q12H  . enoxaparin  40 mg Subcutaneous Q24H  . metronidazole  500 mg Intravenous Q8H  . morphine   Intravenous Q4H    Assessment/Plan Perforated diverticulitis  S/p Exploratory laparotomy with sigmoid  colectomy and end  Colostomy. 01/17/2013, Lodema Pilot, DO   Plan:  Add IV tylenol, continue to mobilize, add K, will check the mag too.  Clamp the NG and see how he does.  LOS: 3 days    Jeremiah Jefferson 01/19/2013

## 2013-01-19 NOTE — Consult Note (Addendum)
WOC ostomy consult  Stoma type/location: Colostomy to left lower quad Stomal assessment/size: Stoma red and viable, above skin level, 1 5/8 inches Peristomal assessment: Intact skin surrounding Output No stool or flatus at present Ostomy pouching: 2 piece with barrier ring.  Education provided: Pt feeling poorly with NG intact.  Demonstrated pouch change using 2 piece pouch and barrier ring.  Pt able to open and close velcro to empty.  Discussed pouching routines and ordering supplies. Educational book at bedside. Pt interested in closed end pouches for use after hospital stay.  Supplies at bedside for staff use.   Cammie Mcgee, RN, MSN, [employer]  [phone]

## 2013-01-20 LAB — BASIC METABOLIC PANEL
BUN: 9 mg/dL (ref 6–23)
CO2: 26 mEq/L (ref 19–32)
Calcium: 8.5 mg/dL (ref 8.4–10.5)
Chloride: 100 mEq/L (ref 96–112)
Creatinine, Ser: 0.78 mg/dL (ref 0.50–1.35)
GFR calc Af Amer: >90 mL/min (ref >90)
GFR calc non Af Amer: >90 mL/min (ref >90)
Glucose, Bld: 128 mg/dL — ABNORMAL HIGH (ref 70–99)
Potassium: 3.6 mEq/L (ref 3.5–5.1)
Sodium: 134 mEq/L — ABNORMAL LOW (ref 135–145)

## 2013-01-20 MED ORDER — OXYCODONE HCL 5 MG PO TABS: 5.0000 mg | ORAL_TABLET | ORAL | Status: DC | PRN

## 2013-01-20 MED ORDER — MORPHINE SULFATE 2 MG/ML IJ SOLN: 1.0000 mg | INTRAMUSCULAR | Status: DC | PRN

## 2013-01-20 MED ORDER — ACETAMINOPHEN 10 MG/ML IV SOLN
1000.0000 mg | Freq: Four times a day (QID) | INTRAVENOUS | Status: AC
  Administered 2013-01-20 – 2013-01-21 (×4): 1000 mg via INTRAVENOUS
  Filled 2013-01-20 (×7): qty 100

## 2013-01-20 MED ORDER — FAMOTIDINE 20 MG PO TABS
20.0000 mg | ORAL_TABLET | Freq: Two times a day (BID) | ORAL | Status: DC
  Administered 2013-01-20 – 2013-01-23 (×7): 20 mg via ORAL
  Filled 2013-01-20 (×8): qty 1

## 2013-01-20 NOTE — Progress Notes (Signed)
4 Days Post-Op   Assessment: s/p Procedure(s): EXPLORATORY LAPAROTOMY COLOSTOMY Patient Active Problem List  Diagnosis  . Diverticulitis of large intestine with perforation    Bowel function returning, progressing normally  Plan: Will d/c ng and start liquids, slow IV  Subjective: Feels better, passing gas, few gas pains  Objective: Vital signs in last 24 hours: Temp:  [97.5 F (36.4 C)-98.4 F (36.9 C)] 97.5 F (36.4 C) (02/26 0645) Pulse Rate:  [80-90] 80 (02/26 0645) Resp:  [15-18] 15 (02/26 0807) BP: (132-146)/(87-95) 136/95 mmHg (02/26 0645) SpO2:  [96 %-100 %] 100 % (02/26 0807)   Intake/Output from previous day: 02/25 0701 - 02/26 0700 In: 1931.7 [I.V.:1031.7; IV Piggyback:900] Out: 3550 [Urine:3550] Intake/Output this shift:     General appearance: alert, cooperative and mild distress Resp: clear to auscultation bilaterally GI: Abd soft, mild incisional tender, BS+, air in colostomy bag  Incision: Clean per nurses - did not repack today  Lab Results:   Recent Labs  01/18/13 0400 01/19/13 0415  WBC 13.0* 8.8  HGB 11.3* 11.7*  HCT 35.9* 36.4*  PLT 134* 144*   BMET  Recent Labs  01/19/13 0415 01/20/13 0415  NA 135 134*  K 3.4* 3.6  CL 101 100  CO2 28 26  GLUCOSE 121* 128*  BUN 11 9  CREATININE 0.91 0.78  CALCIUM 8.4 8.5   PT/INR No results found for this basename: LABPROT, INR,  in the last 72 hours ABG No results found for this basename: PHART, PCO2, PO2, HCO3,  in the last 72 hours  MEDS, Scheduled . antiseptic oral rinse  15 mL Mouth Rinse q12n4p  . chlorhexidine  15 mL Mouth Rinse BID  . ciprofloxacin  400 mg Intravenous Q12H  . enoxaparin  40 mg Subcutaneous Q24H  . fluticasone  2 spray Each Nare Daily  . metronidazole  500 mg Intravenous Q8H  . morphine   Intravenous Q4H    Studies/Results: No results found.    LOS: 4 days     Currie Paris, MD, [facility],  [place] [phone]   01/20/2013 9:29 AM

## 2013-01-21 NOTE — Progress Notes (Signed)
5 Days Post-Op   Assessment: s/p Procedure(s): EXPLORATORY LAPAROTOMY COLOSTOMY Patient Active Problem List  Diagnosis  . Diverticulitis of large intestine with perforation    Progressing as expected  Plan: Advance diet  Subjective: Feels better some gas cramps, passing gas and stool out ostomy. Feels he needs more instructions in ostomy management  Objective: Vital signs in last 24 hours: Temp:  [98 F (36.7 C)-98.3 F (36.8 C)] 98 F (36.7 C) (02/27 0607) Pulse Rate:  [62-79] 62 (02/27 0607) Resp:  [18] 18 (02/27 0607) BP: (127-135)/(86-92) 135/86 mmHg (02/27 0607) SpO2:  [96 %-98 %] 96 % (02/27 0607)   Intake/Output from previous day: 02/26 0701 - 02/27 0700 In: 3080.8 [P.O.:600; I.V.:1680.8; IV Piggyback:800] Out: 3475 [Urine:3475] Intake/Output this shift: Total I/O In: 120 [P.O.:120] Out: -    General appearance: alert, cooperative and no distress GI: soft, stoll in ostomy  Incision: healing well  Lab Results:   Recent Labs  01/19/13 0415  WBC 8.8  HGB 11.7*  HCT 36.4*  PLT 144*   BMET  Recent Labs  01/19/13 0415 01/20/13 0415  NA 135 134*  K 3.4* 3.6  CL 101 100  CO2 28 26  GLUCOSE 121* 128*  BUN 11 9  CREATININE 0.91 0.78  CALCIUM 8.4 8.5   PT/INR No results found for this basename: LABPROT, INR,  in the last 72 hours ABG No results found for this basename: PHART, PCO2, PO2, HCO3,  in the last 72 hours  MEDS, Scheduled . antiseptic oral rinse  15 mL Mouth Rinse q12n4p  . chlorhexidine  15 mL Mouth Rinse BID  . ciprofloxacin  400 mg Intravenous Q12H  . enoxaparin  40 mg Subcutaneous Q24H  . famotidine  20 mg Oral BID  . fluticasone  2 spray Each Nare Daily  . metronidazole  500 mg Intravenous Q8H    Studies/Results: No results found.    LOS: 5 days     Currie Paris, MD, [facility], [place] [phone]   01/21/2013 9:33 AM

## 2013-01-22 MED ORDER — CIPROFLOXACIN HCL 500 MG PO TABS
500.0000 mg | ORAL_TABLET | Freq: Two times a day (BID) | ORAL | Status: DC
  Administered 2013-01-22 – 2013-01-23 (×3): 500 mg via ORAL
  Filled 2013-01-22 (×5): qty 1

## 2013-01-22 MED ORDER — METRONIDAZOLE 500 MG PO TABS
500.0000 mg | ORAL_TABLET | Freq: Three times a day (TID) | ORAL | Status: DC
  Administered 2013-01-22 – 2013-01-23 (×4): 500 mg via ORAL
  Filled 2013-01-22 (×7): qty 1

## 2013-01-22 NOTE — Consult Note (Signed)
WOC ostomy consult  Stoma type/location:  Pt with colostomy stoma to left lower quad Stomal assessment/size: Stoma red and viable, slightly above skin level, 1 5/8 inches Output Mod liquid brown stool Ostomy pouching: 2pc. With barrier ring Education provided: Pt states he applied new bag this AM using 2 piece and barrier ring without any assistance.  He is able to open and close velcro to empty. Current pouch intact with good seal, did not remove but assessed stoma through pouch.  Discussed pouching routines and ordering supplies.  Denies further questions at this time.  Placed on [facility] discharge program.  Supplies left at bedside for patient use.  Educational materials left at bedside.    Cammie Mcgee, RN, MSN, [employer]  [phone]

## 2013-01-22 NOTE — Discharge Summary (Signed)
Physician Discharge Summary  Patient ID: Jeremiah Jefferson. MRN: [id number] DOB/AGE: 1966/04/11 47 y.o. PCP: No primary provider on file.  Admit date: 01/16/2013 Discharge date: 01/23/2013  Admission Diagnoses: Diverticulitis with perforation    Discharge Diagnoses: Diverticulitis with perforation Principal Problem:   Diverticulitis of large intestine with perforation   PROCEDURES: Exploratory laparotomy with sigmoid colectomy and end  Colostomy. 01/17/2013 Jeremiah Pilot, MD    Hospital Course: HPI: was asked to evaluate this patient for perforated diverticulitis. This patient is normally in good health and woke up feeling fine this morning. After eating lunch today he began feeling as though he needed further restroom and was feeling "gassy". He was having some bloating and some lower abdominal pain across his belt line. He had a bowel movement which he says was normal without any blood or melena without any significant relief he said that the pain continued to increase mainly in the right lower quadrant but he was feeling this across his belt to the left lower quadrant and diffusely. He had some fever this afternoon when he checked into the emergency room but that has not had any fevers or chills. He does have frequent heartburn and takes Pepcid over-the-counter and usually takes one to 3 times per day. He says that he has never had a colonoscopy and has never had any similar episodes prior to this. Patient was seen in the ER and evaluated by Dr. Biagio Jefferson. He was then taken to the operating room for the above procedure.   Pathology report: Diagnosis Colon, segmental resection, sigmoid - DIVERTICULITIS WITH PERFORATION AND PERICOLONIC ABSCESS. - NO EVIDENCE OF MALIGNANCY. Jeremiah Picket MD Pathologist, Electronic Signature (Case signed 01/19/2013)  No problems post operatively. His diet has been advanced. His colostomy is working well. Plan discharge home.   Follow up with Dr. Biagio Jefferson in 2  weeks.   Condition on discharge: improved.      Disposition: Final discharge disposition not confirmed     Medication List    ASK your doctor about these medications       calcium carbonate 500 MG chewable tablet  Commonly known as:  TUMS - dosed in mg elemental calcium  Chew 2 tablets by mouth 4 (four) times daily as needed for heartburn.     mometasone 50 MCG/ACT nasal spray  Commonly known as:  NASONEX  Place 2 sprays into the nose daily.       Follow-up Information   Follow up with Jefferson, Jeremiah DAVID, DO. Schedule an appointment as soon as possible for a visit in 14 days.   Contact information:   [address] [place] [address] [phone]       Signed: Sherrie Jefferson 01/22/2013, 1:18 PM

## 2013-01-22 NOTE — Progress Notes (Signed)
6 Days Post-Op   Assessment: s/p Procedure(s): EXPLORATORY LAPAROTOMY COLOSTOMY Patient Active Problem List  Diagnosis  . Diverticulitis of large intestine with perforation    Improving daily  Plan: Plan for discharge tomorrow Will switch to oral antibiotic today Will advance diet to solid food Will recheck CBC inAM  Subjective: Feels better daily, voiding OK, minimal pain tolerating diet ostomy working  Objective: Vital signs in last 24 hours: Temp:  [97.7 F (36.5 C)-98.7 F (37.1 C)] 97.7 F (36.5 C) (02/28 0609) Pulse Rate:  [70-84] 70 (02/28 0609) Resp:  [18-20] 18 (02/28 0609) BP: (137-139)/(90-91) 139/90 mmHg (02/28 0609) SpO2:  [97 %-100 %] 98 % (02/28 0609)   Intake/Output from previous day: 02/27 0701 - 02/28 0700 In: 3452.5 [P.O.:1140; I.V.:1612.5; IV Piggyback:700] Out: 3500 [Urine:3500]  General appearance: alert, cooperative and no distress GI: soft, non-tender; bowel sounds normal; no masses,  no organomegaly  Incision: healing well, Needs continued dressing changes  Lab Results:  No results found for this basename: WBC, HGB, HCT, PLT,  in the last 72 hours BMET  Recent Labs  01/20/13 0415  NA 134*  K 3.6  CL 100  CO2 26  GLUCOSE 128*  BUN 9  CREATININE 0.78  CALCIUM 8.5    MEDS, Scheduled . antiseptic oral rinse  15 mL Mouth Rinse q12n4p  . chlorhexidine  15 mL Mouth Rinse BID  . ciprofloxacin  400 mg Intravenous Q12H  . enoxaparin  40 mg Subcutaneous Q24H  . famotidine  20 mg Oral BID  . fluticasone  2 spray Each Nare Daily  . metronidazole  500 mg Intravenous Q8H    Studies/Results: No results found.    LOS: 6 days     Currie Paris, MD, [facility], [place] [phone]   01/22/2013 7:50 AM

## 2013-01-23 LAB — CBC
HCT: 39.1 % (ref 39.0–52.0)
Hemoglobin: 13.2 g/dL (ref 13.0–17.0)
MCH: 25.9 pg — ABNORMAL LOW (ref 26.0–34.0)
MCHC: 33.8 g/dL (ref 30.0–36.0)
MCV: 76.8 fL — ABNORMAL LOW (ref 78.0–100.0)
Platelets: 190 10*3/uL (ref 150–400)
RBC: 5.09 MIL/uL (ref 4.22–5.81)
RDW: 15 % (ref 11.5–15.5)
WBC: 7.5 10*3/uL (ref 4.0–10.5)

## 2013-01-23 MED ORDER — HYDROCODONE-ACETAMINOPHEN 5-325 MG PO TABS: 1.0000 | ORAL_TABLET | ORAL | Status: DC | PRN

## 2013-01-23 NOTE — Progress Notes (Signed)
Patient ID: Jeremiah Major., male   DOB: [birth year], 47 y.o.   MRN: [id number]  General Surgery - [facility] - Progress Note  POD# 7  Subjective: Patient doing well.  Ambulatory.  Tolerating regular diet.  Objective: Vital signs in last 24 hours: Temp:  [97.9 F (36.6 C)-98.8 F (37.1 C)] 97.9 F (36.6 C) (03/01 0425) Pulse Rate:  [79-99] 79 (03/01 0425) Resp:  [18] 18 (03/01 0425) BP: (124-135)/(85-89) 124/85 mmHg (03/01 0425) SpO2:  [96 %-100 %] 96 % (03/01 0425) Last BM Date: 01/22/13  Intake/Output from previous day: 02/28 0701 - 03/01 0700 In: -  Out: 2875 [Urine:2775; Stool:100]  Exam: HEENT - clear, not icteric Neck - soft Chest - clear bilaterally Cor - RRR, no murmur Abd - soft without distension; stoma viable with stool in bag; midline wound with early granulation tissue, no drainage, wet to dry dressing Ext - no significant edema Neuro - grossly intact, no focal deficits  Lab Results:   Recent Labs  01/23/13 0434  WBC 7.5  HGB 13.2  HCT 39.1  PLT 190    No results found for this basename: NA, K, CL, CO2, GLUCOSE, BUN, CREATININE, CALCIUM,  in the last 72 hours  Studies/Results: No results found.  Assessment / Plan: 1.  Status post Hartmann's resection for perforated diverticular disease  Discharge home today  Rx Cipro and Flagyl for 5 more days  HHN for wound care and ostomy care  Follow up in [facility] office 1-2 weeks with Dr. Renae Fickle, MD, [facility] Office: [phone]  01/23/2013

## 2013-01-23 NOTE — Progress Notes (Signed)
Assessment unchanged. Pt and wife verbalized understanding of dc instructions. Scripts x3 given to pt as provided by Dr. Gerrit Friends. Pt aware and understands dressing to continue at home once daily. Case Manager communicated with Genevieve Norlander regarding dressing changes as well as ostomy care/needs. Pt independent emptying colostomy with some supplies already provided by [facility] RN. Pt introduced to My Chart with verbalized understanding. Discharged via wheelchair to front entrance to meet awaiting vehicle to carry home. Accompanied by family and NT.

## 2013-01-23 NOTE — Care Management Note (Signed)
   CARE MANAGEMENT NOTE 01/23/2013  Patient:  Jeremiah Jefferson, Jeremiah Jefferson   Account Number:  [id number]  Date Initiated:  01/17/2013  Documentation initiated by:  [facility]  Subjective/Objective Assessment:   47 year old male admitted s/p exp lap with sigmoid colectomy and colostomy.     Action/Plan:   Independent PTA.  01/23/2013 Met with pt and wife, re HH needs. Genevieve Norlander to provide services, updated HH orders faxed to [facility] and spoke to rep Marisa Sprinkles [facility] needs.   Anticipated DC Date:  01/23/2013   Anticipated DC Plan:  HOME W HOME HEALTH SERVICES      DC Planning Services  CM consult      [facility] Choice  HOME HEALTH   Choice offered to / List presented to:  C-1 Patient        HH arranged  HH-1 RN      [facility] agency  [facility]   Status of service:  Completed, signed off Medicare Important Message given?  NA - LOS <3 / Initial given by admissions (If response is "NO", the following Medicare IM given date fields will be blank) Date Medicare IM given:   Date Additional Medicare IM given:    Discharge Disposition:  HOME W HOME HEALTH SERVICES  Per UR Regulation:  Reviewed for med. necessity/level of care/duration of stay  If discussed at Long Length of Stay Meetings, dates discussed:    Comments:  01/23/2013 [facility] orders faxed to [facility] @ [phone] per request of Johny Drilling rep. Johny Shock RN MPH Case Manager  01-22-13 Lorenda Ishihara RN CM 1000 Spoke with patient at bedside, he wanted to use [place] manager for [facility] services. Contacted them and they do not provide skilled services such as RN. Patient then chose [facility], spoke with Debbie form [place] and she will follow the patient for d/c needs. Awaiting final orders.  01-20-12 Lorenda Ishihara RN CM 1000 Spoke with patient at bedside. Did not want to discuss HH at this time, continues with NG at present. Left list of [facility] agencies for choice for patient to look over. Patient will discuss with spouse and decide. Will f/u  tomorrow.

## 2013-01-29 ENCOUNTER — Encounter (INDEPENDENT_AMBULATORY_CARE_PROVIDER_SITE_OTHER): Payer: BC Managed Care – PPO | Admitting: General Surgery

## 2013-02-02 ENCOUNTER — Ambulatory Visit (INDEPENDENT_AMBULATORY_CARE_PROVIDER_SITE_OTHER): Payer: BC Managed Care – PPO | Admitting: General Surgery

## 2013-02-02 ENCOUNTER — Encounter (INDEPENDENT_AMBULATORY_CARE_PROVIDER_SITE_OTHER): Payer: Self-pay | Admitting: General Surgery

## 2013-02-02 VITALS — BP 102/68 | HR 87 | Temp 98.9°F | Resp 18 | Ht 68.0 in | Wt 155.4 lb

## 2013-02-02 DIAGNOSIS — Z5189 Encounter for other specified aftercare: Secondary | ICD-10-CM

## 2013-02-02 DIAGNOSIS — Z4889 Encounter for other specified surgical aftercare: Secondary | ICD-10-CM

## 2013-02-02 NOTE — Progress Notes (Signed)
Subjective:     Patient ID: Jeremiah Major., male   DOB: [birth year], 47 y.o.   MRN: [id number]  HPI This patient follows up 2 weeks status post exploratory laparotomy with sigmoid colectomy and Hartmann procedure for perforated diverticulitis. He is off pain medications and his ostomy is functioning well. He actually returned to work today. He still has an open wound in the midline which is coming in very nicely and has been having home health nurse pack the wound. He has no complaints.  Review of Systems     Objective:   Physical Exam No distress nontoxic appearing cecum in the bed. His ostomy is pink and healthy and is functioning fine. His midline wound is healing in nicely as well with healthy pink granulation tissue. I repack his wound today and redressed this    Assessment:     Status post exploratory laparotomy and a sigmoid colectomy for perforated diverticulitis-doing well He seems to be recovering nicely from his procedure. His wound is healing nicely and should be much longer before this is completely healed. I will see him back in about 3 weeks for repeat wound evaluation and after that we will set him up for colonoscopy and prepare for ostomy reversal in about 3 months     Plan:     I recommend high-fiber diet and continued wet-to-dry wound care with and routine ostomy care. I will see him back in about 3 or 4 weeks for repeat wound check

## 2013-02-04 ENCOUNTER — Encounter (INDEPENDENT_AMBULATORY_CARE_PROVIDER_SITE_OTHER): Payer: BC Managed Care – PPO | Admitting: General Surgery

## 2013-02-05 ENCOUNTER — Encounter (INDEPENDENT_AMBULATORY_CARE_PROVIDER_SITE_OTHER): Payer: BC Managed Care – PPO | Admitting: General Surgery

## 2013-03-04 ENCOUNTER — Ambulatory Visit (INDEPENDENT_AMBULATORY_CARE_PROVIDER_SITE_OTHER): Payer: BC Managed Care – PPO | Admitting: General Surgery

## 2013-03-04 ENCOUNTER — Encounter (INDEPENDENT_AMBULATORY_CARE_PROVIDER_SITE_OTHER): Payer: Self-pay | Admitting: General Surgery

## 2013-03-04 VITALS — BP 120/70 | HR 94 | Resp 16 | Ht 68.0 in | Wt 154.0 lb

## 2013-03-04 DIAGNOSIS — Z5189 Encounter for other specified aftercare: Secondary | ICD-10-CM

## 2013-03-04 DIAGNOSIS — Z4889 Encounter for other specified surgical aftercare: Secondary | ICD-10-CM

## 2013-03-04 NOTE — Progress Notes (Signed)
Subjective:     Patient ID: Jeremiah Major., male   DOB: 03-05-1966, 46 y.o.   MRN: [id number]  HPI This patient follows up one month status post laparotomy with sigmoid colectomy and colostomy for perforated diverticulitis.  His wound has almost completely healed.  He has returned to work and ostomy is functioning well.  Review of Systems     Objective:   Physical Exam His wound has almost completely epithelialized.  There is a 1.5cm area of granulation tissue near the base to which I applied some silver nitrate but I think that this should completely heal in the next 2 weeks or so.      Assessment:     History of diverticulitis She has recovered nicely from his initial surgery at is interested in ostomy reversal as soon as possible. I recommended that he have a colonoscopy to prior to colostomy takedown and that we scheduled his surgery for no significant the beginning of June. He will get his colonoscopy and he'll followup with me to discuss surgery    Plan:     Continue wound care and plan for colonoscopy preoperatively and surgery for colostomy takedown in 2 months

## 2013-03-05 ENCOUNTER — Other Ambulatory Visit (INDEPENDENT_AMBULATORY_CARE_PROVIDER_SITE_OTHER): Payer: Self-pay

## 2013-03-05 ENCOUNTER — Telehealth (INDEPENDENT_AMBULATORY_CARE_PROVIDER_SITE_OTHER): Payer: Self-pay | Admitting: General Surgery

## 2013-03-05 DIAGNOSIS — Z5189 Encounter for other specified aftercare: Secondary | ICD-10-CM

## 2013-03-05 NOTE — Telephone Encounter (Signed)
Left a message for patient he has appt with DR Leone Payor at [facility]  [address]  On 03/09/13 2:45

## 2013-03-09 ENCOUNTER — Ambulatory Visit: Payer: BC Managed Care – PPO | Admitting: Internal Medicine

## 2013-03-10 NOTE — Telephone Encounter (Signed)
Patient called back to r/s appt. He thought it was May 15th and missed it. I gave him the number to [facility] and he will call to r/s.

## 2013-04-09 ENCOUNTER — Encounter (INDEPENDENT_AMBULATORY_CARE_PROVIDER_SITE_OTHER): Payer: Self-pay

## 2013-04-09 NOTE — Progress Notes (Signed)
Orders written for Colostomy takedown, patient must first have a colonoscopy per Dr. Biagio Quint.  Patient has appointment with Dr. Leone Payor @ [facility] on 04/13/13.  Will hold surgical orders until office note from GI indicates patient has completed his colonoscopy.

## 2013-04-13 ENCOUNTER — Ambulatory Visit (INDEPENDENT_AMBULATORY_CARE_PROVIDER_SITE_OTHER): Payer: BC Managed Care – PPO | Admitting: Internal Medicine

## 2013-04-13 ENCOUNTER — Encounter: Payer: Self-pay | Admitting: Internal Medicine

## 2013-04-13 VITALS — BP 100/70 | Ht 67.0 in | Wt 156.0 lb

## 2013-04-13 DIAGNOSIS — K5732 Diverticulitis of large intestine without perforation or abscess without bleeding: Secondary | ICD-10-CM

## 2013-04-13 MED ORDER — NA SULFATE-K SULFATE-MG SULF 17.5-3.13-1.6 GM/177ML PO SOLN: ORAL | Status: DC

## 2013-04-13 NOTE — Progress Notes (Signed)
  Subjective:    Patient ID: Jeremiah Major., male    DOB: 02-19-1966, 47 y.o.   MRN: [id number]  HPI The patient is here because he is awaiting takedown of a colostomy after diverticulitis and perforation of sigmoid colon. He is doing well after the surgery with no GI complaints.  Allergies  Allergen Reactions  . Erythromycin Other (See Comments)    Stomach upset   Outpatient Prescriptions Prior to Visit  Medication Sig Dispense Refill  . Ascorbic Acid (VITAMIN C) 1000 MG tablet Take 1,000 mg by mouth daily.      Marland Kitchen loratadine (CLARITIN) 10 MG tablet Take 10 mg by mouth daily.      . mometasone (NASONEX) 50 MCG/ACT nasal spray Place 2 sprays into the nose daily.      . pseudoephedrine (SUDAFED) 120 MG 12 hr tablet Take 120 mg by mouth every 12 (twelve) hours.          Past Medical History  Diagnosis Date  . Diverticulitis of large intestine with perforation 01/16/13  . Allergic rhinitis    Past Surgical History  Procedure Laterality Date  . Vasectomy    . Laparotomy N/A 01/16/2013    Procedure: EXPLORATORY LAPAROTOMY;  Surgeon: Lodema Pilot, DO;  Location: [facility];  Service: General;  Laterality: N/A;  SIGMOID  COLECTOMY  . Colostomy Left 01/16/2013    Procedure: COLOSTOMY;  Surgeon: Lodema Pilot, DO;  Location: [facility];  Service: General;  Laterality: Left;   History   Social History  . Marital Status: Married            . Years of Education:  Psychologst   Social History Main Topics  . Smoking status: Never Smoker   . Smokeless tobacco: Never Used  . Alcohol Use: Yes     Comment: ocassional beer/wine  . Drug Use: No  . Sexually Active: Yes          Social History Narrative  . Psychologist   Family History  Problem Relation Age of Onset  . Diabetes Mother   . Lung cancer Father   . Anxiety disorder Sister   . Prostate cancer Brother   . Emphysema Sister   . Diabetes Brother     Review of Systems As above/otherwise negative    Objective:   Physical  Exam General:  NAD Eyes:   anicteric Lungs:  clear Heart:  S1S2 no rubs, murmurs or gallops Abdomen:  soft and nontender, BS+ colostomy in LLQ  Data Reviewed:  Op note and hospital H and P 12/2012     Assessment & Plan:  Diverticulitis s/p resection and colostomy - awaiting takedown of colostomy  1. Colonoscopy prior to take down of colostomy is appropriate to screen for other lesions that could require surgical resection. The risks and benefits as well as alternatives of endoscopic procedure(s) have been discussed and reviewed. All questions answered. The patient agrees to proceed.  I appreciate the opportunity to care for this patient.  Cc: Lodema Pilot, DO

## 2013-04-13 NOTE — Patient Instructions (Addendum)
You have been scheduled for a colonoscopy with propofol. Please follow written instructions given to you at your visit today.  Please pick up your prep kit at the pharmacy within the next 1-3 days. If you use inhalers (even only as needed), please bring them with you on the day of your procedure. Your physician has requested that you go to www.startemmi.com and enter the access code given to you at your visit today. This web site gives a general overview about your procedure. However, you should still follow specific instructions given to you by our office regarding your preparation for the procedure.   Thank you for choosing me and [facility].  Carl E. Gessner, M.D., FACG  

## 2013-04-15 ENCOUNTER — Ambulatory Visit (AMBULATORY_SURGERY_CENTER): Payer: BC Managed Care – PPO | Admitting: Internal Medicine

## 2013-04-15 ENCOUNTER — Encounter: Payer: Self-pay | Admitting: Internal Medicine

## 2013-04-15 VITALS — BP 121/71 | HR 70 | Temp 98.8°F | Resp 18 | Ht 67.0 in | Wt 156.0 lb

## 2013-04-15 DIAGNOSIS — D126 Benign neoplasm of colon, unspecified: Secondary | ICD-10-CM

## 2013-04-15 DIAGNOSIS — K648 Other hemorrhoids: Secondary | ICD-10-CM

## 2013-04-15 DIAGNOSIS — K5732 Diverticulitis of large intestine without perforation or abscess without bleeding: Secondary | ICD-10-CM

## 2013-04-15 MED ORDER — SODIUM CHLORIDE 0.9 % IV SOLN: 500.0000 mL | INTRAVENOUS | Status: DC

## 2013-04-15 NOTE — Progress Notes (Signed)
Called to room to assist during endoscopic procedure.  Patient ID and intended procedure confirmed with present staff. Received instructions for my participation in the procedure from the performing physician.  

## 2013-04-15 NOTE — Progress Notes (Signed)
Patient did not experience any of the following events: a burn prior to discharge; a fall within the facility; wrong site/side/patient/procedure/implant event; or a hospital transfer or hospital admission upon discharge from the facility. (G8907) Patient did not have preoperative order for IV antibiotic SSI prophylaxis. (G8918)  

## 2013-04-15 NOTE — Patient Instructions (Addendum)
I found one small polyp and removed it. You also have hemorrhoids.  The remainder of the exam was normal.  I will let you know pathology results and when to have another routine colonoscopy by mail.  I appreciate the opportunity to care for you.  Iva Boop, MD, FACG  YOU HAD AN ENDOSCOPIC PROCEDURE TODAY AT THE [facility]: Refer to the procedure report that was given to you for any specific questions about what was found during the examination.  If the procedure report does not answer your questions, please call your gastroenterologist to clarify.  If you requested that your care partner not be given the details of your procedure findings, then the procedure report has been included in a sealed envelope for you to review at your convenience later.  YOU SHOULD EXPECT: Some feelings of bloating in the abdomen. Passage of more gas than usual.  Walking can help get rid of the air that was put into your GI tract during the procedure and reduce the bloating. If you had a lower endoscopy (such as a colonoscopy or flexible sigmoidoscopy) you may notice spotting of blood in your stool or on the toilet paper. If you underwent a bowel prep for your procedure, then you may not have a normal bowel movement for a few days.  DIET: Your first meal following the procedure should be a light meal and then it is ok to progress to your normal diet.  A half-sandwich or bowl of soup is an example of a good first meal.  Heavy or fried foods are harder to digest and may make you feel nauseous or bloated.  Likewise meals heavy in dairy and vegetables can cause extra gas to form and this can also increase the bloating.  Drink plenty of fluids but you should avoid alcoholic beverages for 24 hours.  ACTIVITY: Your care partner should take you home directly after the procedure.  You should plan to take it easy, moving slowly for the rest of the day.  You can resume normal activity the day after the procedure  however you should NOT DRIVE or use heavy machinery for 24 hours (because of the sedation medicines used during the test).    SYMPTOMS TO REPORT IMMEDIATELY: A gastroenterologist can be reached at any hour.  During normal business hours, 8:30 AM to 5:00 PM Monday through Friday, call [phone].  After hours and on weekends, please call the GI answering service at [phone] who will take a message and have the physician on call contact you.   Following lower endoscopy (colonoscopy or flexible sigmoidoscopy):  Excessive amounts of blood in the stool  Significant tenderness or worsening of abdominal pains  Swelling of the abdomen that is new, acute  Fever of 100F or higher  FOLLOW UP: If any biopsies were taken you will be contacted by phone or by letter within the next 1-3 weeks.  Call your gastroenterologist if you have not heard about the biopsies in 3 weeks.  Our staff will call the home number listed on your records the next business day following your procedure to check on you and address any questions or concerns that you may have at that time regarding the information given to you following your procedure. This is a courtesy call and so if there is no answer at the home number and we have not heard from you through the emergency physician on call, we will assume that you have returned to your regular daily  activities without incident.  SIGNATURES/CONFIDENTIALITY: You and/or your care partner have signed paperwork which will be entered into your electronic medical record.  These signatures attest to the fact that that the information above on your After Visit Summary has been reviewed and is understood.  Full responsibility of the confidentiality of this discharge information lies with you and/or your care-partner.  Polyp, hemorrhoids-handouts given  Repeat colonoscopy will be determined by pathology

## 2013-04-15 NOTE — Op Note (Signed)
[facility] [address] [place], [address]   COLONOSCOPY PROCEDURE REPORT  PATIENT: Jeremiah Jefferson, Jeremiah Jefferson  MR#: [id number] BIRTHDATE: 1965/11/26 , 47  yrs. old GENDER: Male ENDOSCOPIST: Iva Boop, MD, [facility] REFERRED XB:JYNWG Biagio Quint, D.O. PROCEDURE DATE:  04/15/2013 PROCEDURE:   Colonoscopy with snare polypectomy    examination of colon via colostomy and of Hartmann's pouch via rectum. ASA CLASS:   Class II INDICATIONS: recent diverticulitis - pre-op exam prior to colostomy take down MEDICATIONS: propofol (Diprivan) 200mg  IV, MAC sedation, administered by CRNA, and These medications were titrated to patient response per physician's verbal order  DESCRIPTION OF PROCEDURE:   After the risks benefits and alternatives of the procedure were thoroughly explained, informed consent was obtained.  A digital rectal exam revealed no abnormalities of the rectum, A digital rectal exam revealed no prostatic nodules, and A digital rectal exam revealed the prostate was not enlarged.   The LB [id number]  endoscope was introduced through the anus and advanced to the cecum, which was identified by both the appendix and ileocecal valve. No adverse events experienced.   The quality of the prep was excellent using Suprep  The instrument was then slowly withdrawn as the colon was fully examined.      COLON FINDINGS: A diminutive sessile polyp was found in the transverse colon.  A polypectomy was performed with a cold snare. The resection was complete and the polyp tissue was completely retrieved.   The colon mucosa was otherwise normal. the Hartmann's pouch was normal with adequate views. Retroflexed rectal views revealed internal hemorrhoids. The time to cecum=1 minutes 05 seconds.  Withdrawal time=8 minutes 42 seconds.  The scope was withdrawn and the procedure completed. COMPLICATIONS: There were no complications.  ENDOSCOPIC IMPRESSION: 1.   Diminutive sessile  polyp was found in the transverse colon; polypectomy was performed with a cold snare 2.   The colon mucosa was otherwise normal  RECOMMENDATIONS: Timing of repeat colonoscopy will be determined by pathology findings.   eSigned:  Iva Boop, MD, [facility] 04/15/2013 4:35 PM   cc: Lodema Pilot DO, Tally Joe, MD, and The Patient

## 2013-04-16 ENCOUNTER — Telehealth: Payer: Self-pay | Admitting: *Deleted

## 2013-04-16 NOTE — Telephone Encounter (Signed)
  Follow up Call-  Call back number 04/15/2013  Post procedure Call Back phone  # [phone]  Permission to leave phone message Yes     Patient questions:  Do you have a fever, pain , or abdominal swelling? no Pain Score  0 *  Have you tolerated food without any problems? yes  Have you been able to return to your normal activities? yes  Do you have any questions about your discharge instructions: Diet   no Medications  no Follow up visit  no  Do you have questions or concerns about your Care? no  Actions: * If pain score is 4 or above: No action needed, pain <4.

## 2013-04-20 ENCOUNTER — Telehealth (INDEPENDENT_AMBULATORY_CARE_PROVIDER_SITE_OTHER): Payer: Self-pay

## 2013-04-20 NOTE — Telephone Encounter (Signed)
Patient called to see if Dr Biagio Quint has reviewed colonoscopy report and to see if he can be scheduled for surgery now. I told patient I would send Dr Biagio Quint a message to review test and we would call him back.

## 2013-04-23 ENCOUNTER — Telehealth (INDEPENDENT_AMBULATORY_CARE_PROVIDER_SITE_OTHER): Payer: Self-pay

## 2013-04-23 ENCOUNTER — Encounter: Payer: Self-pay | Admitting: Internal Medicine

## 2013-04-23 NOTE — Telephone Encounter (Signed)
Left message for patient to return my call to the office

## 2013-04-23 NOTE — Progress Notes (Signed)
Quick Note:  Benign mucosa, no polyp Repeat colonoscopy 2024 ______

## 2013-04-27 ENCOUNTER — Telehealth (INDEPENDENT_AMBULATORY_CARE_PROVIDER_SITE_OTHER): Payer: Self-pay

## 2013-04-27 ENCOUNTER — Other Ambulatory Visit (INDEPENDENT_AMBULATORY_CARE_PROVIDER_SITE_OTHER): Payer: Self-pay | Admitting: General Surgery

## 2013-04-27 DIAGNOSIS — K5732 Diverticulitis of large intestine without perforation or abscess without bleeding: Secondary | ICD-10-CM

## 2013-04-27 NOTE — Telephone Encounter (Signed)
Sent an Inbasket message to Dr. Biagio Quint on 04/26/13 and paged him today with a reminder of entering orders in EPIC.  I will re-page him.

## 2013-04-27 NOTE — Telephone Encounter (Signed)
Patient states he had spoke with Katie in scheduling and she states she  does not have orders surgery for colostomy reversal, He is very anxious  About going forward with the surgery. He was under the  impression that the orders had been completed by Dr. Biagio Quint last week.  Please advise

## 2013-04-28 ENCOUNTER — Telehealth (INDEPENDENT_AMBULATORY_CARE_PROVIDER_SITE_OTHER): Payer: Self-pay

## 2013-04-28 NOTE — Telephone Encounter (Signed)
Spoke to patient this morning regarding scheduling his Colostomy Takedown.  Explained to patient that orders have been entered into EPIC and Katie in [place] scheduling will be working on getting patient scheduled for surgery.  Patient aware that we will call him once we have a date in place.  Patient has completed his colonoscopy and is ready to proceed with surgery.  When speaking with OR scheduler Erin on Friday 04/23/13, the 1st availalble date that was June 16, 2013.  This was not Dr. Biagio Quint stating patient cannot have surgery before this date.  Spoke to [place] in scheduling and made her aware that patient can be scheduled anytime based on availability and that Dr. Biagio Quint did not state that patient had to wait to be scheduled after June 16, 2013.  Patient aware that Florentina Addison will proceed with scheduling his surgery and will call him with a date & time.

## 2013-05-06 ENCOUNTER — Encounter (HOSPITAL_COMMUNITY): Payer: Self-pay | Admitting: Pharmacy Technician

## 2013-05-10 ENCOUNTER — Telehealth (INDEPENDENT_AMBULATORY_CARE_PROVIDER_SITE_OTHER): Payer: Self-pay

## 2013-05-10 NOTE — Telephone Encounter (Signed)
The patient called reporting he has an infected scratch below his belt line.  He went to urgent care and they put him on Keflex.  His takedown surgery is 6/24.  He wondered if Dr Biagio Quint needs to see him preop.  I told him the main thing is that his treating physician clears him for surgery.  I told him to call us if he gets fever or it gets worse.  Dr Biagio Quint can check him in the holding area.  Please call him if he needs to come in here preop.

## 2013-05-11 NOTE — Pre-Procedure Instructions (Signed)
Jeremiah Jefferson  05/11/2013   Your procedure is scheduled on:  Tuesday, June 24th   Report to [facility] at 11:45 AM.             (Come to Entrance "A", follow signs to [facility] and take those to 3rd floor)   Call this number if you have problems the morning of surgery: [phone]   Remember:   Do not eat food or drink liquids after midnight Monday.   Take these medicines the morning of surgery with A SIP OF WATER: Flonase, Claritin   Do not wear jewelry.  Do not wear lotions, powders, or colognes. You may NOT wear deodorant.   Men may shave face and neck.   Do not bring valuables to the hospital.  [facility] is not responsible for any belongings or valuables.  Contacts, dentures or bridgework may not be worn into surgery.   Leave suitcase in the car. After surgery it may be brought to your room.  For patients admitted to the hospital, checkout time is 11:00 AM the day of discharge.   Name and phone number of your driver:    Special Instructions: Shower using CHG 2 nights before surgery and the night before surgery.  If you shower the day of surgery use CHG.  Use special wash - you have one bottle of CHG for all showers.  You should use approximately 1/3 of the bottle for each shower.   Please read over the following fact sheets that you were given: Pain Booklet, Coughing and Deep Breathing, MRSA Information and Surgical Site Infection Prevention

## 2013-05-12 ENCOUNTER — Encounter (HOSPITAL_COMMUNITY)
Admission: RE | Admit: 2013-05-12 | Discharge: 2013-05-12 | Disposition: A | Discharge status: Home / Self Care | Payer: BC Managed Care – PPO | Source: Ambulatory Visit | Attending: General Surgery | Admitting: General Surgery

## 2013-05-12 ENCOUNTER — Encounter (HOSPITAL_COMMUNITY): Payer: Self-pay

## 2013-05-12 VITALS — BP 113/82 | HR 78 | Temp 98.5°F | Resp 20 | Ht 67.0 in | Wt 155.9 lb

## 2013-05-12 DIAGNOSIS — Z01812 Encounter for preprocedural laboratory examination: Secondary | ICD-10-CM | POA: Insufficient documentation

## 2013-05-12 DIAGNOSIS — K5732 Diverticulitis of large intestine without perforation or abscess without bleeding: Secondary | ICD-10-CM

## 2013-05-12 HISTORY — DX: Other seasonal allergic rhinitis: J30.2

## 2013-05-12 LAB — BASIC METABOLIC PANEL
BUN: 9 mg/dL (ref 6–23)
CO2: 27 mEq/L (ref 19–32)
Calcium: 9.1 mg/dL (ref 8.4–10.5)
Chloride: 101 mEq/L (ref 96–112)
Creatinine, Ser: 0.82 mg/dL (ref 0.50–1.35)
GFR calc Af Amer: >90 mL/min (ref >90)
GFR calc non Af Amer: >90 mL/min (ref >90)
Glucose, Bld: 86 mg/dL (ref 70–99)
Potassium: 4 mEq/L (ref 3.5–5.1)
Sodium: 138 mEq/L (ref 135–145)

## 2013-05-12 LAB — CBC WITH DIFFERENTIAL/PLATELET
Basophils Absolute: 0.1 10*3/uL (ref 0.0–0.1)
Basophils Relative: 1 % (ref 0–1)
Eosinophils Absolute: 0.1 10*3/uL (ref 0.0–0.7)
Eosinophils Relative: 2 % (ref 0–5)
HCT: 42.2 % (ref 39.0–52.0)
Hemoglobin: 14.4 g/dL (ref 13.0–17.0)
Lymphocytes Relative: 22 % (ref 12–46)
Lymphs Abs: 1.4 10*3/uL (ref 0.7–4.0)
MCH: 27.2 pg (ref 26.0–34.0)
MCHC: 34.1 g/dL (ref 30.0–36.0)
MCV: 79.6 fL (ref 78.0–100.0)
Monocytes Absolute: 0.7 10*3/uL (ref 0.1–1.0)
Monocytes Relative: 12 % (ref 3–12)
Neutro Abs: 3.9 10*3/uL (ref 1.7–7.7)
Neutrophils Relative %: 64 % (ref 43–77)
Platelets: 178 10*3/uL (ref 150–400)
RBC: 5.3 MIL/uL (ref 4.22–5.81)
RDW: 14.7 % (ref 11.5–15.5)
WBC: 6.1 10*3/uL (ref 4.0–10.5)

## 2013-05-12 LAB — SURGICAL PCR SCREEN
MRSA, PCR: NEGATIVE
Staphylococcus aureus: NEGATIVE

## 2013-05-13 ENCOUNTER — Encounter (HOSPITAL_COMMUNITY): Payer: Self-pay | Admitting: Vascular Surgery

## 2013-05-13 NOTE — Progress Notes (Signed)
Anesthesia Chart Review (triggered due to surgeon's routine order for Anesthesia Consult):  Patient is a 47 year old male scheduled for colostomy takedown on 05/18/13.  He suffered a perforated sigmoid colon due to diverticulitis and underwent laparotomy with sigmoid colectomy and colostomy on 01/16/13.  Other history includes allergic rhinitis, non-smoker, vasectomy, nasal surgery.  Labs acceptable.  Anticipate that he can proceed as planned from an anesthesia standpoint.  Velna Ochs [facility]/Anesthesiology Phone [phone] 05/13/2013 11:27 AM

## 2013-05-14 ENCOUNTER — Telehealth (INDEPENDENT_AMBULATORY_CARE_PROVIDER_SITE_OTHER): Payer: Self-pay

## 2013-05-14 NOTE — Telephone Encounter (Signed)
Pt called stating he has skin infection on lower abd that was cultured. DX yesterday came back as mrsa. Pt placed on Doxycyline x 10 days by his PCP yesterday. Pt concerned he may have to delay surgery. I reviewed this with Dr Jamey Ripa since Dr Biagio Quint is off. Per Dr Jamey Ripa pt advised surgery on 6-24 needs to be cxed., needs to continue antibiotic, see Dr Biagio Quint next week to eval infection. I made appt for pt to see Dr Biagio Quint next Thursday. Pt states he understands. Debbie in scheduling advised need to cx surgery.

## 2013-05-18 ENCOUNTER — Encounter (HOSPITAL_COMMUNITY): Admission: RE | Payer: Self-pay | Source: Ambulatory Visit

## 2013-05-18 ENCOUNTER — Inpatient Hospital Stay (HOSPITAL_COMMUNITY): Admission: RE | Admit: 2013-05-18 | Payer: BC Managed Care – PPO | Source: Ambulatory Visit | Admitting: General Surgery

## 2013-05-18 SURGERY — COLOSTOMY CLOSURE
Anesthesia: General

## 2013-05-20 ENCOUNTER — Ambulatory Visit (INDEPENDENT_AMBULATORY_CARE_PROVIDER_SITE_OTHER): Payer: BC Managed Care – PPO | Admitting: General Surgery

## 2013-05-20 VITALS — BP 132/80 | HR 68 | Temp 99.0°F | Resp 18 | Ht 67.0 in | Wt 153.0 lb

## 2013-05-20 DIAGNOSIS — L0291 Cutaneous abscess, unspecified: Secondary | ICD-10-CM

## 2013-05-20 DIAGNOSIS — L0390 Cellulitis, unspecified: Secondary | ICD-10-CM

## 2013-05-20 NOTE — Progress Notes (Signed)
Subjective:     Patient ID: Jeremiah Jefferson, male   DOB: 1966/04/18, 47 y.o.   MRN: [id number]  HPI This patient follows up for repeat evaluation for lower abdominal wound infection.  He was scheduled for colostomy reversal and developed a small boil on his right lower quadrant which has been drained and cultured MRSA. He remains on doxycycline has about 3 days left. He has no significant discomfort and the redness has improved. He has no fevers or chills his surgery has been rescheduled   Review of Systems     Objective:   Physical Exam His wound looks okay without any residual cellulitis or purulence. His ostomy in his midline incision look fine    Assessment:     Status post sigmoid colectomy and colostomy, right lower quadrant skin abscess His abscess seems to be doing okay and I recommended that he finished his antibiotics as already prescribed. There is should be minimal wound care associated with this and he can discover this with a dry gauze twice daily. I don't think that he needs any repeat antibiotics once his current course is complete. He can go ahead and schedule his surgery for August as this should be healed by then     Plan:     Continuing care and finish antibiotic and I will go ahead and schedule surgery for after he returns from his vacation in August and this should be healed by then

## 2013-06-07 ENCOUNTER — Encounter (HOSPITAL_COMMUNITY): Payer: Self-pay | Admitting: Pharmacy Technician

## 2013-06-08 NOTE — Progress Notes (Signed)
06-08-13 Pt. Needs order for how consent shall read.

## 2013-06-09 ENCOUNTER — Encounter (HOSPITAL_COMMUNITY)
Admission: RE | Admit: 2013-06-09 | Discharge: 2013-06-09 | Disposition: A | Discharge status: Home / Self Care | Payer: BC Managed Care – PPO | Source: Ambulatory Visit | Attending: General Surgery | Admitting: General Surgery

## 2013-06-09 ENCOUNTER — Encounter (HOSPITAL_COMMUNITY): Payer: Self-pay

## 2013-06-09 VITALS — BP 117/84 | HR 70 | Temp 98.1°F | Resp 18 | Ht 67.0 in | Wt 155.0 lb

## 2013-06-09 DIAGNOSIS — Z01812 Encounter for preprocedural laboratory examination: Secondary | ICD-10-CM | POA: Insufficient documentation

## 2013-06-09 DIAGNOSIS — K5732 Diverticulitis of large intestine without perforation or abscess without bleeding: Secondary | ICD-10-CM

## 2013-06-09 LAB — CBC
HCT: 46.6 % (ref 39.0–52.0)
Hemoglobin: 15.7 g/dL (ref 13.0–17.0)
MCH: 27.6 pg (ref 26.0–34.0)
MCHC: 33.7 g/dL (ref 30.0–36.0)
MCV: 82 fL (ref 78.0–100.0)
Platelets: 135 10*3/uL — ABNORMAL LOW (ref 150–400)
RBC: 5.68 MIL/uL (ref 4.22–5.81)
RDW: 14.5 % (ref 11.5–15.5)
WBC: 5.7 10*3/uL (ref 4.0–10.5)

## 2013-06-09 NOTE — Patient Instructions (Addendum)
20 Jeremiah Jefferson  06/09/2013   Your procedure is scheduled on: 7-22   -2014  Report to [facility] at     0515   AM.  Call this number if you have problems the morning of surgery: [phone]  Or Presurgical Testing [phone](Ayssa Bentivegna)   Remember: Follow any bowel prep instructions per MD office.(Fleet Enema night before and AM of /  Follow 2 day bowel prep instructions per office)    Do not eat food:After Midnight.  Take these medicines the morning of surgery with A SIP OF WATER: Loratadine. Flonase nasal spray. Avoid Aspirin.aleve. Ibuprofen, herbal and over the counter vitamin supllements.   Do not wear jewelry, make-up or nail polish.  Do not wear lotions, powders, or perfumes. You may wear deodorant.  Do not shave 12 hours prior to first CHG shower(legs and under arms).(face and neck okay.)  Do not bring valuables to the hospital.  Contacts, dentures or bridgework,body piercing,  may not be worn into surgery.  Leave suitcase in the car. After surgery it may be brought to your room.  For patients admitted to the hospital, checkout time is 11:00 AM the day of discharge.   Patients discharged the day of surgery will not be allowed to drive home. Must have responsible person with you x 24 hours once discharged.  Name and phone number of your driver: osborn, pullin- 409- [phone] cell  Special Instructions: CHG(Chlorhedine 4%-"Hibiclens","Betasept","Aplicare") Shower Use Special Wash: see special instructions.(avoid face and genitals)      Failure to follow these instructions may result in Cancellation of your surgery.   Patient signature_______________________________________________________

## 2013-06-14 ENCOUNTER — Telehealth (INDEPENDENT_AMBULATORY_CARE_PROVIDER_SITE_OTHER): Payer: Self-pay | Admitting: General Surgery

## 2013-06-14 NOTE — Telephone Encounter (Signed)
I called him to make sure that he was on the liquid diet and understood the preop instructions.  Will get a rectal enema tonight and one tomorrow on arrival to the hospital (or can be done at home if he prefers as well).  Will plan for colostomy takedown tomorrow.

## 2013-06-15 ENCOUNTER — Encounter (HOSPITAL_COMMUNITY): Payer: Self-pay | Admitting: Anesthesiology

## 2013-06-15 ENCOUNTER — Encounter (HOSPITAL_COMMUNITY): Payer: Self-pay | Admitting: *Deleted

## 2013-06-15 ENCOUNTER — Encounter (HOSPITAL_COMMUNITY)
Admission: RE | Disposition: A | Discharge status: Home / Self Care | Payer: Self-pay | Source: Ambulatory Visit | Attending: General Surgery

## 2013-06-15 ENCOUNTER — Inpatient Hospital Stay (HOSPITAL_COMMUNITY): Payer: BC Managed Care – PPO | Admitting: Anesthesiology

## 2013-06-15 ENCOUNTER — Inpatient Hospital Stay (HOSPITAL_COMMUNITY)
Admission: RE | Admit: 2013-06-15 | Discharge: 2013-06-20 | DRG: 153 | Disposition: A | Discharge status: Home / Self Care | Payer: BC Managed Care – PPO | Source: Ambulatory Visit | Attending: General Surgery | Admitting: General Surgery

## 2013-06-15 DIAGNOSIS — Z01812 Encounter for preprocedural laboratory examination: Secondary | ICD-10-CM

## 2013-06-15 DIAGNOSIS — Z433 Encounter for attention to colostomy: Secondary | ICD-10-CM

## 2013-06-15 DIAGNOSIS — S31109A Unspecified open wound of abdominal wall, unspecified quadrant without penetration into peritoneal cavity, initial encounter: Secondary | ICD-10-CM

## 2013-06-15 DIAGNOSIS — K5732 Diverticulitis of large intestine without perforation or abscess without bleeding: Secondary | ICD-10-CM

## 2013-06-15 HISTORY — PX: COLOSTOMY CLOSURE: SHX1381

## 2013-06-15 SURGERY — COLOSTOMY CLOSURE
Anesthesia: General | Site: Abdomen | Wound class: Clean Contaminated

## 2013-06-15 MED ORDER — FLUTICASONE PROPIONATE 50 MCG/ACT NA SUSP
2.0000 | Freq: Every day | NASAL | Status: DC
  Administered 2013-06-17 – 2013-06-20 (×4): 2 via NASAL
  Filled 2013-06-15: qty 16

## 2013-06-15 MED ORDER — MEPERIDINE HCL 50 MG/ML IJ SOLN: 6.2500 mg | INTRAMUSCULAR | Status: DC | PRN

## 2013-06-15 MED ORDER — HEPARIN SODIUM (PORCINE) 5000 UNIT/ML IJ SOLN
5000.0000 [IU] | Freq: Once | INTRAMUSCULAR | Status: AC
  Administered 2013-06-15: 5000 [IU] via SUBCUTANEOUS
  Filled 2013-06-15: qty 1

## 2013-06-15 MED ORDER — LACTATED RINGERS IV SOLN
INTRAVENOUS | Status: DC | PRN
  Administered 2013-06-15 (×4): via INTRAVENOUS

## 2013-06-15 MED ORDER — OXYCODONE HCL 5 MG PO TABS: 5.0000 mg | ORAL_TABLET | Freq: Once | ORAL | Status: DC | PRN

## 2013-06-15 MED ORDER — DEXAMETHASONE SODIUM PHOSPHATE 10 MG/ML IJ SOLN
INTRAMUSCULAR | Status: DC | PRN
  Administered 2013-06-15: 10 mg via INTRAVENOUS

## 2013-06-15 MED ORDER — MIDAZOLAM HCL 5 MG/5ML IJ SOLN
INTRAMUSCULAR | Status: DC | PRN
  Administered 2013-06-15: 2 mg via INTRAVENOUS

## 2013-06-15 MED ORDER — SODIUM CHLORIDE 0.9 % IV SOLN
1.0000 g | INTRAVENOUS | Status: AC
  Administered 2013-06-15: 1 g via INTRAVENOUS

## 2013-06-15 MED ORDER — KETOROLAC TROMETHAMINE 30 MG/ML IJ SOLN
30.0000 mg | Freq: Once | INTRAMUSCULAR | Status: AC
  Administered 2013-06-15: 30 mg via INTRAVENOUS

## 2013-06-15 MED ORDER — MORPHINE SULFATE (PF) 1 MG/ML IV SOLN
INTRAVENOUS | Status: DC
  Administered 2013-06-15: 1.5 mg via INTRAVENOUS
  Administered 2013-06-15: 12:00:00 via INTRAVENOUS
  Administered 2013-06-15: 1.5 mg via INTRAVENOUS
  Administered 2013-06-16: 3.72 mg via INTRAVENOUS
  Administered 2013-06-16: 9 mg via INTRAVENOUS
  Administered 2013-06-16 (×2): 3 mg via INTRAVENOUS
  Administered 2013-06-16: 19:00:00 via INTRAVENOUS
  Administered 2013-06-16: 3 mg via INTRAVENOUS
  Administered 2013-06-16 – 2013-06-17 (×4): 1.5 mg via INTRAVENOUS
  Administered 2013-06-17: 4.5 mg via INTRAVENOUS
  Administered 2013-06-17 – 2013-06-18 (×3): 1.5 mg via INTRAVENOUS
  Filled 2013-06-15: qty 25

## 2013-06-15 MED ORDER — NALOXONE HCL 0.4 MG/ML IJ SOLN: 0.4000 mg | INTRAMUSCULAR | Status: DC | PRN

## 2013-06-15 MED ORDER — HYDROMORPHONE HCL PF 1 MG/ML IJ SOLN: 0.2500 mg | INTRAMUSCULAR | Status: DC | PRN

## 2013-06-15 MED ORDER — EPHEDRINE SULFATE 50 MG/ML IJ SOLN
INTRAMUSCULAR | Status: DC | PRN
  Administered 2013-06-15: 10 mg via INTRAVENOUS
  Administered 2013-06-15: 5 mg via INTRAVENOUS

## 2013-06-15 MED ORDER — ONDANSETRON HCL 4 MG/2ML IJ SOLN: 4.0000 mg | Freq: Four times a day (QID) | INTRAMUSCULAR | Status: DC | PRN

## 2013-06-15 MED ORDER — ENOXAPARIN SODIUM 40 MG/0.4ML ~~LOC~~ SOLN
40.0000 mg | SUBCUTANEOUS | Status: DC
  Administered 2013-06-16 – 2013-06-20 (×5): 40 mg via SUBCUTANEOUS
  Filled 2013-06-15 (×6): qty 0.4

## 2013-06-15 MED ORDER — NEOSTIGMINE METHYLSULFATE 1 MG/ML IJ SOLN
INTRAMUSCULAR | Status: DC | PRN
  Administered 2013-06-15: 4 mg via INTRAVENOUS

## 2013-06-15 MED ORDER — ONDANSETRON HCL 4 MG/2ML IJ SOLN
INTRAMUSCULAR | Status: DC | PRN
  Administered 2013-06-15: 4 mg via INTRAVENOUS

## 2013-06-15 MED ORDER — FENTANYL CITRATE 0.05 MG/ML IJ SOLN
INTRAMUSCULAR | Status: DC | PRN
  Administered 2013-06-15: 100 ug via INTRAVENOUS
  Administered 2013-06-15 (×3): 25 ug via INTRAVENOUS
  Administered 2013-06-15: 50 ug via INTRAVENOUS

## 2013-06-15 MED ORDER — SODIUM CHLORIDE 0.9 % IV SOLN
1.0000 g | INTRAVENOUS | Status: AC
  Administered 2013-06-16: 1 g via INTRAVENOUS
  Filled 2013-06-15: qty 1

## 2013-06-15 MED ORDER — CISATRACURIUM BESYLATE (PF) 10 MG/5ML IV SOLN
INTRAVENOUS | Status: DC | PRN
  Administered 2013-06-15 (×2): 2 mg via INTRAVENOUS
  Administered 2013-06-15: 3 mg via INTRAVENOUS
  Administered 2013-06-15: 8 mg via INTRAVENOUS
  Administered 2013-06-15: 4 mg via INTRAVENOUS

## 2013-06-15 MED ORDER — DIPHENHYDRAMINE HCL 50 MG/ML IJ SOLN: 12.5000 mg | Freq: Four times a day (QID) | INTRAMUSCULAR | Status: DC | PRN

## 2013-06-15 MED ORDER — PROPOFOL 10 MG/ML IV BOLUS
INTRAVENOUS | Status: DC | PRN
  Administered 2013-06-15: 150 mg via INTRAVENOUS

## 2013-06-15 MED ORDER — SUCCINYLCHOLINE CHLORIDE 20 MG/ML IJ SOLN
INTRAMUSCULAR | Status: DC | PRN
  Administered 2013-06-15: 100 mg via INTRAVENOUS

## 2013-06-15 MED ORDER — 0.9 % SODIUM CHLORIDE (POUR BTL) OPTIME
TOPICAL | Status: DC | PRN
  Administered 2013-06-15: 2000 mL

## 2013-06-15 MED ORDER — ADULT MULTIVITAMIN W/MINERALS CH
1.0000 | ORAL_TABLET | Freq: Every day | ORAL | Status: DC
  Administered 2013-06-15 – 2013-06-20 (×4): 1 via ORAL
  Filled 2013-06-15 (×6): qty 1

## 2013-06-15 MED ORDER — LACTATED RINGERS IV SOLN
INTRAVENOUS | Status: DC
  Administered 2013-06-15: 12:00:00 via INTRAVENOUS

## 2013-06-15 MED ORDER — HYDROMORPHONE HCL PF 1 MG/ML IJ SOLN
0.2500 mg | INTRAMUSCULAR | Status: DC | PRN
  Administered 2013-06-15 (×6): 0.5 mg via INTRAVENOUS

## 2013-06-15 MED ORDER — ACETAMINOPHEN 10 MG/ML IV SOLN
1000.0000 mg | Freq: Once | INTRAVENOUS | Status: AC | PRN
  Administered 2013-06-15: 1000 mg via INTRAVENOUS
  Filled 2013-06-15: qty 100

## 2013-06-15 MED ORDER — MIDAZOLAM HCL 2 MG/2ML IJ SOLN
1.0000 mg | INTRAMUSCULAR | Status: DC | PRN
  Administered 2013-06-15: 1 mg via INTRAVENOUS

## 2013-06-15 MED ORDER — GLYCOPYRROLATE 0.2 MG/ML IJ SOLN
INTRAMUSCULAR | Status: DC | PRN
  Administered 2013-06-15: 0.6 mg via INTRAVENOUS

## 2013-06-15 MED ORDER — KCL IN DEXTROSE-NACL 20-5-0.45 MEQ/L-%-% IV SOLN
INTRAVENOUS | Status: DC
  Administered 2013-06-15 – 2013-06-18 (×8): via INTRAVENOUS
  Filled 2013-06-15 (×12): qty 1000

## 2013-06-15 MED ORDER — OXYCODONE HCL 5 MG/5ML PO SOLN
5.0000 mg | Freq: Once | ORAL | Status: DC | PRN
  Filled 2013-06-15: qty 5

## 2013-06-15 MED ORDER — CHLORHEXIDINE GLUCONATE 4 % EX LIQD
1.0000 "application " | Freq: Once | CUTANEOUS | Status: DC
  Filled 2013-06-15: qty 15

## 2013-06-15 MED ORDER — SODIUM CHLORIDE 0.9 % IJ SOLN: 9.0000 mL | INTRAMUSCULAR | Status: DC | PRN

## 2013-06-15 MED ORDER — PROMETHAZINE HCL 25 MG/ML IJ SOLN: 6.2500 mg | INTRAMUSCULAR | Status: DC | PRN

## 2013-06-15 MED ORDER — DIPHENHYDRAMINE HCL 12.5 MG/5ML PO ELIX: 12.5000 mg | ORAL_SOLUTION | Freq: Four times a day (QID) | ORAL | Status: DC | PRN

## 2013-06-15 MED ORDER — MIDAZOLAM HCL 2 MG/2ML IJ SOLN
1.0000 mg | Freq: Once | INTRAMUSCULAR | Status: AC
  Administered 2013-06-15: 1 mg via INTRAVENOUS

## 2013-06-15 SURGICAL SUPPLY — 59 items
BLADE EXTENDED COATED 6.5IN (ELECTRODE) ×2 IMPLANT
BLADE HEX COATED 2.75 (ELECTRODE) ×3 IMPLANT
CANISTER SUCTION 2500CC (MISCELLANEOUS) ×2 IMPLANT
CHLORAPREP W/TINT 26ML (MISCELLANEOUS) ×2 IMPLANT
CLIP TI LARGE 6 (CLIP) IMPLANT
CLOTH BEACON ORANGE TIMEOUT ST (SAFETY) ×2 IMPLANT
COVER MAYO STAND STRL (DRAPES) ×2 IMPLANT
COVER SURGICAL LIGHT HANDLE (MISCELLANEOUS) ×2 IMPLANT
DRAPE LAPAROSCOPIC ABDOMINAL (DRAPES) ×2 IMPLANT
DRAPE LG THREE QUARTER DISP (DRAPES) ×1 IMPLANT
DRAPE UTILITY XL STRL (DRAPES) ×3 IMPLANT
DRAPE WARM FLUID 44X44 (DRAPE) ×2 IMPLANT
ELECT REM PT RETURN 9FT ADLT (ELECTROSURGICAL) ×2
ELECTRODE REM PT RTRN 9FT ADLT (ELECTROSURGICAL) ×1 IMPLANT
GAUZE XEROFORM 4X4 STRL (GAUZE/BANDAGES/DRESSINGS) ×1 IMPLANT
GAUZE XEROFORM 5X9 LF (GAUZE/BANDAGES/DRESSINGS) ×1 IMPLANT
GLOVE BIOGEL PI IND STRL 7.0 (GLOVE) ×1 IMPLANT
GLOVE BIOGEL PI INDICATOR 7.0 (GLOVE) ×1
GLOVE SURG SS PI 7.5 STRL IVOR (GLOVE) ×4 IMPLANT
GOWN PREVENTION PLUS LG XLONG (DISPOSABLE) ×1 IMPLANT
GOWN STRL NON-REIN LRG LVL3 (GOWN DISPOSABLE) ×1 IMPLANT
GOWN STRL REIN XL XLG (GOWN DISPOSABLE) ×7 IMPLANT
KIT BASIN OR (CUSTOM PROCEDURE TRAY) ×3 IMPLANT
LEGGING LITHOTOMY PAIR STRL (DRAPES) ×1 IMPLANT
LIGASURE IMPACT 36 18CM CVD LR (INSTRUMENTS) IMPLANT
NS IRRIG 1000ML POUR BTL (IV SOLUTION) ×4 IMPLANT
PACK GENERAL/GYN (CUSTOM PROCEDURE TRAY) ×2 IMPLANT
PENCIL BUTTON HOLSTER BLD 10FT (ELECTRODE) ×1 IMPLANT
SCALPEL HARMONIC ACE (MISCELLANEOUS) IMPLANT
SHEARS FOC LG CVD HARMONIC 17C (MISCELLANEOUS) IMPLANT
SLEEVE SURGEON STRL (DRAPES) ×1 IMPLANT
SPONGE GAUZE 4X4 12PLY (GAUZE/BANDAGES/DRESSINGS) ×2 IMPLANT
SPONGE LAP 18X18 X RAY DECT (DISPOSABLE) ×1 IMPLANT
STAPLER CIRC CVD 29MM 37CM (STAPLE) ×1 IMPLANT
STAPLER PROXIMATE 75MM BLUE (STAPLE) ×1 IMPLANT
STAPLER VISISTAT 35W (STAPLE) ×2 IMPLANT
SUCTION POOLE TIP (SUCTIONS) ×2 IMPLANT
SUT ETHILON 2 0 PS N (SUTURE) ×1 IMPLANT
SUT PDS AB 1 CTX 36 (SUTURE) IMPLANT
SUT PDS AB 1 TP1 96 (SUTURE) ×2 IMPLANT
SUT PDS AB 3-0 CT2 27 (SUTURE) IMPLANT
SUT PDS AB 3-0 SH 27 (SUTURE) ×2 IMPLANT
SUT PDS AB 4-0 SH 27 (SUTURE) IMPLANT
SUT PROLENE 0 CT 1 CR/8 (SUTURE) ×2 IMPLANT
SUT PROLENE 2 0 BLUE (SUTURE) ×1 IMPLANT
SUT SILK 2 0 (SUTURE) ×2
SUT SILK 2 0 SH CR/8 (SUTURE) ×3 IMPLANT
SUT SILK 2 0SH CR/8 30 (SUTURE) IMPLANT
SUT SILK 2-0 18XBRD TIE 12 (SUTURE) ×2 IMPLANT
SUT SILK 2-0 30XBRD TIE 12 (SUTURE) IMPLANT
SUT SILK 3 0 (SUTURE)
SUT SILK 3 0 SH CR/8 (SUTURE) ×2 IMPLANT
SUT SILK 3-0 18XBRD TIE 12 (SUTURE) ×2 IMPLANT
SUT VIC AB 2-0 SH 18 (SUTURE) ×1 IMPLANT
TOWEL OR 17X26 10 PK STRL BLUE (TOWEL DISPOSABLE) ×2 IMPLANT
TOWEL OR NON WOVEN STRL DISP B (DISPOSABLE) ×2 IMPLANT
TRAY FOLEY CATH 14FRSI W/METER (CATHETERS) ×2 IMPLANT
YANKAUER SUCT BULB TIP 10FT TU (MISCELLANEOUS) ×1 IMPLANT
YANKAUER SUCT BULB TIP NO VENT (SUCTIONS) ×2 IMPLANT

## 2013-06-15 NOTE — Op Note (Signed)
NAMEOMARR, HANN NO.:  [id number]  MEDICAL RECORD NO.:  [id number]  LOCATION:  1536                         FACILITY:  [facility]  PHYSICIAN:  Lodema Pilot, MD       DATE OF BIRTH:  Apr 07, 1966  DATE OF PROCEDURE:  06/15/2013 DATE OF DISCHARGE:                              OPERATIVE REPORT   PROCEDURE:  Colostomy takedown and takedown of Hartmann procedure and placement of incisional wound VAC.  PREOPERATIVE DIAGNOSIS:  History of diverticulitis.  POSTOPERATIVE DIAGNOSIS:  History of diverticulitis..  SURGEON:  Lodema Pilot, MD  ASSISTANT:  Dr. Andrey Campanile.  ANESTHESIA:  General endotracheal anesthesia.  FLUIDS:  3600 mL of crystalloid.  ESTIMATED BLOOD LOSS:  150 mL.  DRAINS:  Incisional wound VAC.  SPECIMENS:  Incidental appendectomy sent to Pathology for permanent sectioning.  COMPLICATIONS:  None apparent.  FINDINGS:  End-to-end circular stapled anastomosis with a 29 mm EEA stapler, appendectomy, and placement of incisional wound VAC.  INDICATION FOR PROCEDURE:  Mr. Coury is a 47 year old male, who in February presented with episode of perforated diverticulitis, which required a Hartmann procedure and sigmoid colectomy for perforated diverticulitis.  He has recovered nicely and desires colostomy takedown including bowel continuity.  OPERATIVE DETAILS:  Mr. Naas was seen and evaluated in the preoperative area and risks and benefits of procedure were again discussed in lay terms.  Informed consent was obtained.  He was taken to the operating room, placed on table in supine position and general endotracheal anesthesia obtained and Foley catheter was placed.  His legs were placed in Yellofin stirrups taking care to pad all pressure points and minimize nerve injury.  His ostomy was sewn closed with a silk suture.  An occlusive dressing was placed.  His abdomen was prepped and draped in a standard surgical fashion and procedure time-out  was performed with all operative team members to confirm proper patient and procedure.  He was given prophylactic antibiotics and prophylactic anticoagulation and this prior midline incision was excised and dissection carried down to the subcutaneous tissue using Bovie electrocautery.  I introduced the fascia in the midline just above his prior incision and peritoneum was entered and the fascia was opened along the length of the midline under direct visualization.  He had some loose omental adhesions to the abdominal wall, but these were all very loose and basically came down with just brushing the adhesion off the abdominal wall and other than that, there were no significant intraabdominal adhesions.  He had a single loop of the descending colon, which was going up to his left lower quadrant ostomy and this was dissected free, and I transected this colon and flushed with the abdominal wall with a GIA-75 stapler.  There were no adhesions to the abdominal wall.  He did have a few interloop adhesions and these were taken down with sharp dissection and the only adhesions that he had was to the appendix to his rectal stump, which was marked with the previously placed Prolene sutures and mobilized the appendix off the rectal stump and due to the mobilization of the appendix, I felt that appendectomy would be best and so, I created a window through the  mesoappendix at the base of the appendix and using the GIA stapler, which I had already opened, I transected the appendix and flushed with the cecum and then dunked the appendiceal staple line and stump using 2- 0 Vicryl Lembert sutures.  I mobilized the white line of Toldt along the descending colon in order to get more length on the descending colon for tension-free anastomosis and mobilized the peritoneum up to the splenic flexure.  The bowel appeared to reach without tension.  I then removed the previously placed Prolene sutures of the rectal  stump and freed up a little bit of the end of the rectal stump but top at the best place to exit the stapler through the anterior rectum anterior to the previous staple line.  The staple line from the descending colon was taken off with Bovie electrocautery.  The EEA sizes were placed and this easily accommodated a 29 mm sizer and a 2-0 Prolene pursestring suture was placed through the fresh end of the descending colon and 29 mm anvil was placed and the suture was secured.  Then, Dr. Andrey Campanile passed the circular stapler through the rectum and appeared to be catching up about 4 cm distal to our staple line and anticipated airtight anastomosis.  So he ran the sizer up through the rectum and was able to get the 29 mm EEA sizer up through this area and eventually the stapler was passed and the spike was exited just anterior to the previous staple line on the rectal stump.  I did not see any residual diverticulosis on the rectum or on the descending colon.  The spike was mated with the anvil and satisfied that the mesentery was not twisted and the approximated anvil on the stapler, an anastomosis was created.  The donuts were inspected on the back table, which were noted to be circumferential and I oversew the anterior portion of the staple line with 2-0 silk Lembert sutures.  I clamped the proximal descending colon and Dr. Andrey Campanile performed a rigid proctoscopy insufflating the anastomosis with air while it was submerged under water with no evidence of any air leak.  The air was removed and the abdomen was irrigated with sterile saline solution.  Until irrigation returned clear, there was no evidence of bleeding.  I then made an elliptical incision around his left lower quadrant ostomy and cored out this prior remainder of the bowel through the abdominal wall and then closed with 2 layers.  The posterior fascia with interrupted 0 Prolene sutures and then the anterior fascia was also approximated  with 0 Prolene interrupted sutures.  The abdomen was again irrigated with sterile saline solution.  The omentum was placed over the bowel and the fascia was approximated with #1 looped PDS suture x2 taking care to avoid injury to the underlying bowel contents.  The sutures were secured and the incisions were irrigated with sterile saline solution. Hemostasis was obtained.  The skin edges were approximated with skin staples at the midline and using interrupted 2-0 nylon vertical mattress sutures, I approximated the skin edges at the ostomy site leaving loose approximation and incisional wound VAC was placed by covering the incision with strips of Xeroform gauze and a thin strip of wound VAC sponge.  The occlusive dressing was applied and the 2 incisions were bridged protecting the skin and the wound VAC was placed at 75 mm of continuous Hg.  All sponge, needle, and instrument counts were correct at the end of the case.  The  patient tolerated the procedure well without apparent complications.         ______________________________ Lodema Pilot, MD    BL/MEDQ  D:  06/15/2013  T:  06/15/2013  Job:  [id number]

## 2013-06-15 NOTE — Brief Op Note (Signed)
06/15/2013  10:42 AM  PATIENT:  Normajean Glasgow  47 y.o. male  PRE-OPERATIVE DIAGNOSIS:  history of perforated diverticulitis  POST-OPERATIVE DIAGNOSIS:  same  PROCEDURE:  Procedure(s): COLOSTOMY CLOSURE (N/A), takedown of Hartman's procedure  SURGEON:  Surgeon(s) and Role:    * Lodema Pilot, DO - Primary    * Atilano Ina, MD - Assisting  PHYSICIAN ASSISTANT:   ASSISTANTS: wilson   ANESTHESIA:   general  EBL:  Total I/O In: 3600 [I.V.:3600] Out: 600 [Urine:450; Blood:150]  BLOOD ADMINISTERED:none  DRAINS: none and incisional wound vac   LOCAL MEDICATIONS USED:  NONE  SPECIMEN:  Source of Specimen:  appendix  DISPOSITION OF SPECIMEN:  PATHOLOGY  COUNTS:  YES  TOURNIQUET:  * No tourniquets in log *  DICTATION: .Other Dictation: Dictation Number dictated  PLAN OF CARE: Admit to inpatient   PATIENT DISPOSITION:  PACU - hemodynamically stable.   Delay start of Pharmacological VTE agent (>24hrs) due to surgical blood loss or risk of bleeding: no

## 2013-06-15 NOTE — Anesthesia Preprocedure Evaluation (Addendum)
Anesthesia Evaluation  Patient identified by MRN, date of birth, ID band Patient awake    Reviewed: Allergy & Precautions, H&P , NPO status , Patient's Chart, lab work & pertinent test results  Airway Mallampati: IV TM Distance: >3 FB Neck ROM: Full  Mouth opening: Limited Mouth Opening  Dental  (+) Dental Advisory Given and Teeth Intact   Pulmonary neg pulmonary ROS,  breath sounds clear to auscultation        Cardiovascular Exercise Tolerance: Good - CAD and - Past MI negative cardio ROS  Rhythm:Regular Rate:Tachycardia     Neuro/Psych negative neurological ROS  negative psych ROS   GI/Hepatic negative GI ROS, Neg liver ROS,   Endo/Other  negative endocrine ROS  Renal/GU negative Renal ROS     Musculoskeletal negative musculoskeletal ROS (+)   Abdominal   Peds  Hematology negative hematology ROS (+)   Anesthesia Other Findings   Reproductive/Obstetrics                           Anesthesia Physical  Anesthesia Plan  ASA: II  Anesthesia Plan: General   Post-op Pain Management:    Induction: Intravenous  Airway Management Planned: Oral ETT  Additional Equipment:   Intra-op Plan:   Post-operative Plan: Extubation in OR  Informed Consent: I have reviewed the patients History and Physical, chart, labs and discussed the procedure including the risks, benefits and alternatives for the proposed anesthesia with the patient or authorized representative who has indicated his/her understanding and acceptance.   Dental advisory given  Plan Discussed with: CRNA  Anesthesia Plan Comments:        Anesthesia Quick Evaluation

## 2013-06-15 NOTE — H&P (Signed)
   Jeremiah Jefferson is an 47 y.o. male.  HPI: known to me for prior perforated diverticulitis requiring Hartman's procedure and end colostomy in 2/14.  He has recovered well and has not had any further symptoms.  He has had completion colonoscopy which was okay.  Desires colostomy takedown.  Past Medical History  Diagnosis Date  . Diverticulitis of large intestine with perforation 01/16/13  . Allergic rhinitis   . Seasonal allergies     Past Surgical History  Procedure Laterality Date  . Vasectomy    . Laparotomy N/A 01/16/2013    Procedure: EXPLORATORY LAPAROTOMY;  Surgeon: Lodema Pilot, DO;  Location: [facility];  Service: General;  Laterality: N/A;  SIGMOID  COLECTOMY  . Colostomy Left 01/16/2013    Procedure: COLOSTOMY;  Surgeon: Lodema Pilot, DO;  Location: [facility];  Service: General;  Laterality: Left;  . Colonoscopy w/ polypectomy    . Elbow surgery Left   . Nose surgery      x 2    Family History  Problem Relation Age of Onset  . Diabetes Mother   . Lung cancer Father   . Anxiety disorder Sister   . Prostate cancer Brother   . Emphysema Sister   . Diabetes Brother     Social History:  reports that he has never smoked. He has never used smokeless tobacco. He reports that  drinks alcohol. He reports that he does not use illicit drugs.  Allergies:  Allergies  Allergen Reactions  . Erythromycin Other (See Comments)    Stomach upset    Medications: I have reviewed the patient's current medications.  No results found for this or any previous visit (from the past 48 hour(s)).  No results found.   Blood pressure 121/91, pulse 77, temperature 98.4 F (36.9 C), temperature source Oral, resp. rate 18. General appearance: alert, cooperative and no distress Head: Normocephalic, without obvious abnormality, atraumatic Resp: clear to auscultation bilaterally Cardio: normal rate, regular GI: soft, NT, ND, incision healed, LLQ ostomy Extremities: extremities normal, atraumatic,  no cyanosis or edema Skin: Skin color, texture, turgor normal. No rashes or lesions Neurologic: Grossly normal  Assessment/Plan: Hx of sigmoid diverticulitis Plan for colostomy takedown today.  I again explained to him the risks of infection, bleeding, pain, scarring, recurrent diverticulitis, anastamotic leak requiring repeat surgery and possible ostomy, wound infection, and possible need for wound care.  He expressed understanding and desires to proceed with colostomy takedown.  Lodema Pilot DAVID 06/15/2013, 7:11 AM

## 2013-06-15 NOTE — Transfer of Care (Signed)
Immediate Anesthesia Transfer of Care Note  Patient: Jeremiah Jefferson  Procedure(s) Performed: Procedure(s): COLOSTOMY CLOSURE (N/A)  Patient Location: PACU  Anesthesia Type:General  Level of Consciousness: awake, sedated and patient cooperative  Airway & Oxygen Therapy: Patient Spontanous Breathing and Patient connected to face mask oxygen  Post-op Assessment: Report given to PACU RN and Post -op Vital signs reviewed and stable  Post vital signs: Reviewed and stable  Complications: No apparent anesthesia complications

## 2013-06-15 NOTE — Anesthesia Postprocedure Evaluation (Signed)
Anesthesia Post Note  Patient: Jeremiah Jefferson  Procedure(s) Performed: Procedure(s) (LRB): COLOSTOMY CLOSURE (N/A)  Anesthesia type: General  Patient location: PACU  Post pain: Pain level controlled  Post assessment: Post-op Vital signs reviewed  Last Vitals: BP 124/85  Pulse 83  Temp(Src) 36.3 C (Oral)  Resp 16  Ht 5\' 7"  (1.702 m)  Wt 155 lb (70.308 kg)  BMI 24.27 kg/m2  SpO2 98%  Post vital signs: Reviewed  Level of consciousness: sedated  Complications: No apparent anesthesia complications

## 2013-06-16 ENCOUNTER — Encounter (HOSPITAL_COMMUNITY): Payer: Self-pay | Admitting: General Surgery

## 2013-06-16 LAB — CBC
HCT: 43 % (ref 39.0–52.0)
Hemoglobin: 14.6 g/dL (ref 13.0–17.0)
MCH: 27.3 pg (ref 26.0–34.0)
MCHC: 34 g/dL (ref 30.0–36.0)
MCV: 80.4 fL (ref 78.0–100.0)
Platelets: 153 10*3/uL (ref 150–400)
RBC: 5.35 MIL/uL (ref 4.22–5.81)
RDW: 14.3 % (ref 11.5–15.5)
WBC: 11.1 10*3/uL — ABNORMAL HIGH (ref 4.0–10.5)

## 2013-06-16 LAB — BASIC METABOLIC PANEL
BUN: 8 mg/dL (ref 6–23)
CO2: 28 mEq/L (ref 19–32)
Calcium: 9.2 mg/dL (ref 8.4–10.5)
Chloride: 98 mEq/L (ref 96–112)
Creatinine, Ser: 0.77 mg/dL (ref 0.50–1.35)
GFR calc Af Amer: >90 mL/min (ref >90)
GFR calc non Af Amer: >90 mL/min (ref >90)
Glucose, Bld: 160 mg/dL — ABNORMAL HIGH (ref 70–99)
Potassium: 4.5 mEq/L (ref 3.5–5.1)
Sodium: 133 mEq/L — ABNORMAL LOW (ref 135–145)

## 2013-06-16 MED ORDER — PANTOPRAZOLE SODIUM 40 MG IV SOLR
40.0000 mg | Freq: Every day | INTRAVENOUS | Status: DC
  Administered 2013-06-16 – 2013-06-17 (×2): 40 mg via INTRAVENOUS
  Filled 2013-06-16 (×3): qty 40

## 2013-06-16 MED ORDER — KETOROLAC TROMETHAMINE 30 MG/ML IJ SOLN
30.0000 mg | Freq: Once | INTRAMUSCULAR | Status: AC | PRN
  Administered 2013-06-16: 30 mg via INTRAVENOUS
  Filled 2013-06-16: qty 1

## 2013-06-16 NOTE — Care Management Note (Signed)
    Page 1 of 1   06/16/2013     1:40:52 PM   CARE MANAGEMENT NOTE 06/16/2013  Patient:  Jeremiah Jefferson, Jeremiah Jefferson   Account Number:  [id number]  Date Initiated:  06/16/2013  Documentation initiated by:  Lorenda Ishihara  Subjective/Objective Assessment:   47 yo male admitted s/p colostomy takedown. PTA lived at home with spouse.     Action/Plan:   Home when stable   Anticipated DC Date:  06/19/2013   Anticipated DC Plan:  HOME/SELF CARE      DC Planning Services  CM consult      Choice offered to / List presented to:             Status of service:  Completed, signed off Medicare Important Message given?   (If response is "NO", the following Medicare IM given date fields will be blank) Date Medicare IM given:   Date Additional Medicare IM given:    Discharge Disposition:  HOME/SELF CARE  Per UR Regulation:  Reviewed for med. necessity/level of care/duration of stay  If discussed at Long Length of Stay Meetings, dates discussed:    Comments:

## 2013-06-16 NOTE — Progress Notes (Signed)
1 Day Post-Op  Subjective: Doing fine.  Minimal pain.  Catheter out this am.  No nausea.   Objective: Vital signs in last 24 hours: Temp:  [95 F (35 C)-98.1 F (36.7 C)] 97.8 F (36.6 C) (07/23 0551) Pulse Rate:  [69-91] 91 (07/23 0551) Resp:  [8-22] 22 (07/23 0750) BP: (113-148)/(82-90) 120/83 mmHg (07/23 0551) SpO2:  [98 %-100 %] 100 % (07/23 0750) Weight:  [155 lb (70.308 kg)] 155 lb (70.308 kg) (07/22 1234)    Intake/Output from previous day: 07/22 0701 - 07/23 0700 In: 6131.3 [I.V.:6081.3; IV Piggyback:50] Out: 5850 [Urine:5700; Blood:150] Intake/Output this shift:    General appearance: alert, cooperative and no distress Resp: nonlabored Cardio: normal rate, regular GI: soft, appropriate midline tenderness, ND, incisional wound vac in place, no erythema Extremities: SCD's bilat  Lab Results:   Recent Labs  06/16/13 0427  WBC 11.1*  HGB 14.6  HCT 43.0  PLT 153   BMET  Recent Labs  06/16/13 0427  NA 133*  K 4.5  CL 98  CO2 28  GLUCOSE 160*  BUN 8  CREATININE 0.77  CALCIUM 9.2   PT/INR No results found for this basename: LABPROT, INR,  in the last 72 hours ABG No results found for this basename: PHART, PCO2, PO2, HCO3,  in the last 72 hours  Studies/Results: No results found.  Anti-infectives: Anti-infectives   Start     Dose/Rate Route Frequency Ordered Stop   06/16/13 0600  ertapenem (INVANZ) 1 g in sodium chloride 0.9 % 50 mL IVPB     1 g 100 mL/hr over 30 Minutes Intravenous Every 24 hours 06/15/13 1258 06/16/13 0537   06/15/13 0551  ertapenem (INVANZ) 1 g in sodium chloride 0.9 % 50 mL IVPB     1 g 100 mL/hr over 30 Minutes Intravenous On call to O.R. 06/15/13 0551 06/15/13 0725      Assessment/Plan: s/p Procedure(s): COLOSTOMY CLOSURE (N/A) foley out this am. start lovenox.  mobilize.  seems to be doing okay. leave incisional wound vac in place until this weekend.    LOS: 1 day    Lodema Pilot DAVID 06/16/2013

## 2013-06-16 NOTE — Progress Notes (Signed)
Doing okay.  Abdomen okay.  HD stable.

## 2013-06-16 NOTE — Progress Notes (Signed)
Called earlier to get an order to help with refulx.  Order for Protonix 40mg  IV QDaily added.  Administered and pt says that he does feel relief.  Will continue to monitor.  Sherron Monday

## 2013-06-17 LAB — BASIC METABOLIC PANEL
BUN: 9 mg/dL (ref 6–23)
CO2: 28 mEq/L (ref 19–32)
Calcium: 8.8 mg/dL (ref 8.4–10.5)
Chloride: 100 mEq/L (ref 96–112)
Creatinine, Ser: 0.77 mg/dL (ref 0.50–1.35)
GFR calc Af Amer: >90 mL/min (ref >90)
GFR calc non Af Amer: >90 mL/min (ref >90)
Glucose, Bld: 112 mg/dL — ABNORMAL HIGH (ref 70–99)
Potassium: 3.8 mEq/L (ref 3.5–5.1)
Sodium: 134 mEq/L — ABNORMAL LOW (ref 135–145)

## 2013-06-17 LAB — CBC
HCT: 41.2 % (ref 39.0–52.0)
Hemoglobin: 13.5 g/dL (ref 13.0–17.0)
MCH: 27.3 pg (ref 26.0–34.0)
MCHC: 32.8 g/dL (ref 30.0–36.0)
MCV: 83.4 fL (ref 78.0–100.0)
Platelets: 139 10*3/uL — ABNORMAL LOW (ref 150–400)
RBC: 4.94 MIL/uL (ref 4.22–5.81)
RDW: 14.7 % (ref 11.5–15.5)
WBC: 9.3 10*3/uL (ref 4.0–10.5)

## 2013-06-17 MED ORDER — KETOROLAC TROMETHAMINE 30 MG/ML IJ SOLN
30.0000 mg | Freq: Once | INTRAMUSCULAR | Status: AC | PRN
  Administered 2013-06-17: 30 mg via INTRAVENOUS
  Filled 2013-06-17: qty 1

## 2013-06-17 MED ORDER — HYDROCODONE-ACETAMINOPHEN 5-325 MG PO TABS
1.0000 | ORAL_TABLET | ORAL | Status: DC | PRN
  Administered 2013-06-17 – 2013-06-19 (×4): 1 via ORAL
  Filled 2013-06-17 (×4): qty 1

## 2013-06-17 NOTE — Progress Notes (Signed)
2 Days Post-Op  Subjective: Pain continues to improve. No nausea.  ?flatus  Objective: Vital signs in last 24 hours: Temp:  [97.6 F (36.4 C)-97.9 F (36.6 C)] 97.6 F (36.4 C) (07/24 0617) Pulse Rate:  [73-90] 73 (07/24 0617) Resp:  [12-24] 12 (07/24 0739) BP: (113-137)/(78-86) 137/85 mmHg (07/24 0617) SpO2:  [98 %-100 %] 100 % (07/24 0739)    Intake/Output from previous day: 07/23 0701 - 07/24 0700 In: 3000 [I.V.:3000] Out: 2250 [Urine:2250] Intake/Output this shift:    General appearance: alert, cooperative and no distress Resp: nonlabored Cardio: normal rate, regular GI: soft, mild tenderness, ND, incisional wound vac in place, no sign of infection, no peritoneal signs, +BS Extremities: SCD's bilat  Lab Results:   Recent Labs  06/16/13 0427 06/17/13 0400  WBC 11.1* 9.3  HGB 14.6 13.5  HCT 43.0 41.2  PLT 153 139*   BMET  Recent Labs  06/16/13 0427 06/17/13 0400  NA 133* 134*  K 4.5 3.8  CL 98 100  CO2 28 28  GLUCOSE 160* 112*  BUN 8 9  CREATININE 0.77 0.77  CALCIUM 9.2 8.8   PT/INR No results found for this basename: LABPROT, INR,  in the last 72 hours ABG No results found for this basename: PHART, PCO2, PO2, HCO3,  in the last 72 hours  Studies/Results: No results found.  Anti-infectives: Anti-infectives   Start     Dose/Rate Route Frequency Ordered Stop   06/16/13 0600  ertapenem (INVANZ) 1 g in sodium chloride 0.9 % 50 mL IVPB     1 g 100 mL/hr over 30 Minutes Intravenous Every 24 hours 06/15/13 1258 06/16/13 0537   06/15/13 0551  ertapenem (INVANZ) 1 g in sodium chloride 0.9 % 50 mL IVPB     1 g 100 mL/hr over 30 Minutes Intravenous On call to O.R. 06/15/13 0551 06/15/13 0725      Assessment/Plan: s/p Procedure(s): COLOSTOMY CLOSURE (N/A) continues to look good.  pain improving.  abdomen is appropriate.  wbc normal and HR normal.  will trial sips of clears. continue to mobilize  LOS: 2 days    Lodema Pilot DAVID 06/17/2013

## 2013-06-18 MED ORDER — ENSURE PUDDING PO PUDG
1.0000 | Freq: Three times a day (TID) | ORAL | Status: DC
  Administered 2013-06-18 (×2): 1 via ORAL
  Filled 2013-06-18 (×9): qty 1

## 2013-06-18 MED ORDER — PANTOPRAZOLE SODIUM 40 MG PO TBEC
40.0000 mg | DELAYED_RELEASE_TABLET | Freq: Every day | ORAL | Status: DC
  Administered 2013-06-18 – 2013-06-20 (×3): 40 mg via ORAL
  Filled 2013-06-18 (×3): qty 1

## 2013-06-18 MED ORDER — MORPHINE SULFATE 2 MG/ML IJ SOLN: 2.0000 mg | INTRAMUSCULAR | Status: DC | PRN

## 2013-06-18 NOTE — Progress Notes (Signed)
3 Days Post-Op  Subjective: Lots of flatus, no BM.  Tolerating liquids.  No nausea  Objective: Vital signs in last 24 hours: Temp:  [98.2 F (36.8 C)-98.4 F (36.9 C)] 98.2 F (36.8 C) (07/25 0630) Pulse Rate:  [83-87] 87 (07/25 0630) Resp:  [12-18] 14 (07/25 0630) BP: (119-127)/(87-91) 125/91 mmHg (07/25 0630) SpO2:  [98 %-100 %] 100 % (07/25 0630) FiO2 (%):  [35 %-43 %] 43 % (07/25 0424) Last BM Date: 06/14/13  Intake/Output from previous day: 07/24 0701 - 07/25 0700 In: 3467.5 [P.O.:480; I.V.:2987.5] Out: 3400 [Urine:2950; Stool:450] Intake/Output this shift:    General appearance: alert, cooperative and no distress Resp: nonlabored Cardio: normal rate, regular GI: soft, no significant tenderness, ND, incisional vac in place, no erythema, he does have some bruising at the lower right aspect of the wound and ecchymosis at the base of the penis, but penis and scrotum okay Extremities: SCD's bilat  Lab Results:   Recent Labs  06/16/13 0427 06/17/13 0400  WBC 11.1* 9.3  HGB 14.6 13.5  HCT 43.0 41.2  PLT 153 139*   BMET  Recent Labs  06/16/13 0427 06/17/13 0400  NA 133* 134*  K 4.5 3.8  CL 98 100  CO2 28 28  GLUCOSE 160* 112*  BUN 8 9  CREATININE 0.77 0.77  CALCIUM 9.2 8.8   PT/INR No results found for this basename: LABPROT, INR,  in the last 72 hours ABG No results found for this basename: PHART, PCO2, PO2, HCO3,  in the last 72 hours  Studies/Results: No results found.  Anti-infectives: Anti-infectives   Start     Dose/Rate Route Frequency Ordered Stop   06/16/13 0600  ertapenem (INVANZ) 1 g in sodium chloride 0.9 % 50 mL IVPB     1 g 100 mL/hr over 30 Minutes Intravenous Every 24 hours 06/15/13 1258 06/16/13 0537   06/15/13 0551  ertapenem (INVANZ) 1 g in sodium chloride 0.9 % 50 mL IVPB     1 g 100 mL/hr over 30 Minutes Intravenous On call to O.R. 06/15/13 0551 06/15/13 0725      Assessment/Plan: s/p Procedure(s): COLOSTOMY CLOSURE  (N/A) Advance diet continue to mobilize.  Hep lock today and change to oral pain meds (though not using much), He looks good and seems to be doing well.  LOS: 3 days    Lodema Pilot DAVID 06/18/2013

## 2013-06-19 NOTE — Progress Notes (Signed)
4 Days Post-Op  Subjective: No complaints.  Minimal pain.  Tolerating diet. +flatus and small BMs   Objective: Vital signs in last 24 hours: Temp:  [97.5 F (36.4 C)-99.6 F (37.6 C)] 97.5 F (36.4 C) (07/26 0416) Pulse Rate:  [66-81] 66 (07/26 0416) Resp:  [16-18] 16 (07/26 0416) BP: (119-132)/(84-88) 123/84 mmHg (07/26 0416) SpO2:  [97 %-100 %] 97 % (07/26 0416) Last BM Date: 06/18/13  Intake/Output from previous day: 07/25 0701 - 07/26 0700 In: 2092.5 [P.O.:1080; I.V.:1012.5] Out: 2550 [Urine:2550] Intake/Output this shift:    General appearance: alert, cooperative and no distress Resp: nonlabored Cardio: normal rate, regular GI: soft, minimal tenderness, ND, incisional vac in place, no signs of infection,  he does have abdominal wall bruising and bruising tracking to the flank Extremities: SCD's bilat  Lab Results:   Recent Labs  06/17/13 0400  WBC 9.3  HGB 13.5  HCT 41.2  PLT 139*   BMET  Recent Labs  06/17/13 0400  NA 134*  K 3.8  CL 100  CO2 28  GLUCOSE 112*  BUN 9  CREATININE 0.77  CALCIUM 8.8   PT/INR No results found for this basename: LABPROT, INR,  in the last 72 hours ABG No results found for this basename: PHART, PCO2, PO2, HCO3,  in the last 72 hours  Studies/Results: No results found.  Anti-infectives: Anti-infectives   Start     Dose/Rate Route Frequency Ordered Stop   06/16/13 0600  ertapenem (INVANZ) 1 g in sodium chloride 0.9 % 50 mL IVPB     1 g 100 mL/hr over 30 Minutes Intravenous Every 24 hours 06/15/13 1258 06/16/13 0537   06/15/13 0551  ertapenem (INVANZ) 1 g in sodium chloride 0.9 % 50 mL IVPB     1 g 100 mL/hr over 30 Minutes Intravenous On call to O.R. 06/15/13 0551 06/15/13 0725      Assessment/Plan: s/p Procedure(s): COLOSTOMY CLOSURE (N/A) Advance diet he continues to look good.  regular diet.  hopefully ready for discharge tomorrow.  LOS: 4 days    Lodema Pilot DAVID 06/19/2013

## 2013-06-20 MED ORDER — HYDROCODONE-ACETAMINOPHEN 5-325 MG PO TABS: 1.0000 | ORAL_TABLET | ORAL | Status: DC | PRN

## 2013-06-20 NOTE — Progress Notes (Signed)
5 Days Post-Op  Subjective: No issues.  BM x2. No significant abdominal discomfort  Objective: Vital signs in last 24 hours: Temp:  [98 F (36.7 C)] 98 F (36.7 C) (07/27 0545) Pulse Rate:  [71-100] 71 (07/27 0545) Resp:  [18] 18 (07/27 0545) BP: (101-113)/(74-83) 112/83 mmHg (07/27 0545) SpO2:  [98 %] 98 % (07/27 0545) Last BM Date: 06/19/13  Intake/Output from previous day: 07/26 0701 - 07/27 0700 In: 600 [P.O.:600] Out: 1850 [Urine:1850] Intake/Output this shift: Total I/O In: -  Out: 400 [Urine:400]  General appearance: alert, cooperative and no distress Resp: nonlabored Cardio: normal rate, regular GI: abdominal wall bruising, now in green/yellow stages, wound vac removed and incision looks good, skin well approximated, no erythema. no abdominal discomfort  Lab Results:  No results found for this basename: WBC, HGB, HCT, PLT,  in the last 72 hours BMET No results found for this basename: NA, K, CL, CO2, GLUCOSE, BUN, CREATININE, CALCIUM,  in the last 72 hours PT/INR No results found for this basename: LABPROT, INR,  in the last 72 hours ABG No results found for this basename: PHART, PCO2, PO2, HCO3,  in the last 72 hours  Studies/Results: No results found.  Anti-infectives: Anti-infectives   Start     Dose/Rate Route Frequency Ordered Stop   06/16/13 0600  ertapenem (INVANZ) 1 g in sodium chloride 0.9 % 50 mL IVPB     1 g 100 mL/hr over 30 Minutes Intravenous Every 24 hours 06/15/13 1258 06/16/13 0537   06/15/13 0551  ertapenem (INVANZ) 1 g in sodium chloride 0.9 % 50 mL IVPB     1 g 100 mL/hr over 30 Minutes Intravenous On call to O.R. 06/15/13 0551 06/15/13 0725      Assessment/Plan: s/p Procedure(s): COLOSTOMY CLOSURE (N/A) He looks good, wound okay.  should be okay for discharge today.  LOS: 5 days    Lodema Pilot DAVID 06/20/2013

## 2013-06-20 NOTE — Discharge Summary (Signed)
  Physician Discharge Summary  Patient ID: Jeremiah Jefferson MRN: [id number] DOB/AGE: [birth year] 47 y.o.  Admit date: 06/15/2013 Discharge date: 06/20/2013  Admission Diagnoses: history of diverticulitis  Discharge Diagnoses: same Active Problems:   * No active hospital problems. *   Discharged Condition: stable  Hospital Course: to OR 06/15/13 for colostomy takedown.  No apparent complications.  Diet advanced on POD 2 and then steadily advanced therafter.  He was moving his bowels on POD 4 and incisional wound vac removed on POD 5.  The wound looked good and he was tolerating regular diet and stable for discharge on POD 5  Consults: None  Significant Diagnostic Studies: none  Treatments: surgery: 06/15/13 colostomy takedown  Disposition: 06-Home-Health Care Svc  Discharge Orders   Future Orders Complete By Expires     Call MD for:  persistant nausea and vomiting  As directed     Call MD for:  redness, tenderness, or signs of infection (pain, swelling, redness, odor or green/yellow discharge around incision site)  As directed     Call MD for:  severe uncontrolled pain  As directed     Call MD for:  temperature >100.4  As directed     Diet - low sodium heart healthy  As directed     Discharge instructions  As directed     Comments:      Call [phone] for follow up appointment for suture removal in 9-10 days No lifting for 4 weeks. May shower tomorrow No swimming or soaking the wound for 3 weeks.    Discharge wound care:  As directed     Comments:      May cover the wound with clean dry gauze daily    Increase activity slowly  As directed         Medication List    STOP taking these medications       acetaminophen 500 MG tablet  Commonly known as:  TYLENOL      TAKE these medications       fluticasone 50 MCG/ACT nasal spray  Commonly known as:  FLONASE  Place 1 spray into the nose 2 (two) times daily.     HYDROcodone-acetaminophen 5-325 MG per tablet  Commonly known  as:  NORCO  Take 1 tablet by mouth every 4 (four) hours as needed for pain.     hydrocortisone valerate cream 0.2 %  Commonly known as:  WESTCORT  Apply 1 application topically daily as needed. For itching     ibuprofen 200 MG tablet  Commonly known as:  ADVIL,MOTRIN  Take 400 mg by mouth every 6 (six) hours as needed for pain.     loratadine 10 MG tablet  Commonly known as:  CLARITIN  Take 10 mg by mouth daily.     multivitamin with minerals Tabs  Take 1 tablet by mouth daily.     pseudoephedrine 120 MG 12 hr tablet  Commonly known as:  SUDAFED  Take 120 mg by mouth daily as needed. For nasal congestion     triamcinolone cream 0.1 %  Commonly known as:  KENALOG  Place 1 application into the right ear daily as needed. For eczema     vitamin C 500 MG tablet  Commonly known as:  ASCORBIC ACID  Take 500 mg by mouth daily.         SignedLodema Pilot DAVID 06/20/2013, 8:36 AM

## 2013-06-20 NOTE — Progress Notes (Signed)
Pt stable, scripts/d/c instructions given with no questions/concerns voiced by pt or family.  Pt transported via wheelchair to private vehicle by NT and family.

## 2013-06-24 ENCOUNTER — Telehealth (INDEPENDENT_AMBULATORY_CARE_PROVIDER_SITE_OTHER): Payer: Self-pay

## 2013-06-24 NOTE — Telephone Encounter (Signed)
Patient called in stating he had a spot of blood at umbilical area. States he woke up this morning and area is a little sensitive like he moved wrong and tweaked it and asked if he should be concerned. He denies any drainage, redness, swelling, fevers and chills. Advised the umbilical area is a sensitive area especially when staples are in because he bends and twists. Explained for him to take it easy today and to call should any of the above mentioned symptoms occur. Patient understood.

## 2013-07-05 ENCOUNTER — Ambulatory Visit (INDEPENDENT_AMBULATORY_CARE_PROVIDER_SITE_OTHER): Payer: BC Managed Care – PPO

## 2013-07-05 DIAGNOSIS — Z4802 Encounter for removal of sutures: Secondary | ICD-10-CM

## 2013-07-05 NOTE — Progress Notes (Signed)
Pt comes in today for for staple and suture removal post colostomy takedown on 6/24.  After getting the pt situated comfortably on the table I examined the incision.  The incision looks good, completely closed with the exception of the area around the belly button.  I then moistened the incision with chloraprep.  I proceeded to remove the staples and sutures.  I then placed a few steri strips on the places of the incision that are not completely closed.

## 2013-07-14 ENCOUNTER — Encounter (INDEPENDENT_AMBULATORY_CARE_PROVIDER_SITE_OTHER): Payer: Self-pay | Admitting: General Surgery

## 2013-07-14 ENCOUNTER — Ambulatory Visit (INDEPENDENT_AMBULATORY_CARE_PROVIDER_SITE_OTHER): Payer: BC Managed Care – PPO | Admitting: General Surgery

## 2013-07-14 VITALS — BP 116/80 | HR 76 | Temp 98.7°F | Resp 14 | Ht 67.5 in | Wt 152.4 lb

## 2013-07-14 DIAGNOSIS — Z4889 Encounter for other specified surgical aftercare: Secondary | ICD-10-CM

## 2013-07-14 DIAGNOSIS — Z5189 Encounter for other specified aftercare: Secondary | ICD-10-CM

## 2013-07-14 NOTE — Progress Notes (Signed)
Subjective:     Patient ID: Jeremiah Jefferson, male   DOB: December 09, 1965, 47 y.o.   MRN: [id number]  HPI 4 weeks s/p colostomy takedown for A. History of perforated diverticulitis. He is doing very well from this procedure. He has returned to work and is increasing his activities. His only complaint is some occasional rectal bleeding which he says is more dark with some occasional bright blood on the stools. He did have a essentially normal colonoscopy prior to his colostomy takedown. He says he moves his bowels about 1-2 times per day and things feel as though they are back to normal with regard to that. He has no chest pain or shortness of breath but he does feel a little weaker than usual  Review of Systems     Objective:   Physical Exam His abdomen is soft and nontender on exam his incisions are healing nicely without any sign of infection his skin edges are well approximated and he does have some bruising near the incisions and around the colostomy site but this bruising is in its resolving stages. I do not appreciate any sign of hernia    Assessment:     Status post colostomy takedown-doing well He seems to be recovering very well from this procedure. He does have some rectal bleeding which I think is probably not unusual for this early postoperatively. It doesn't sound as though it is a significant amount of bleeding but we will go ahead and check a hemoglobin today.  I recommended that he take a multivitamin with iron and he can gradually increase his activity and diet as tolerated. I also recommended that he continue with high-fiber diet     Plan:     CBC today Multivitamin and high-fiber diet Gradually increase activity as tolerated and I will see him back as needed assuming his hemoglobin is reasonable, otherwise remaining further workup of this although he has had normal cscope prior to his ostomy takedown.

## 2013-07-15 LAB — CBC
HCT: 41.1 % (ref 39.0–52.0)
Hemoglobin: 14.1 g/dL (ref 13.0–17.0)
MCH: 27.8 pg (ref 26.0–34.0)
MCHC: 34.3 g/dL (ref 30.0–36.0)
MCV: 80.9 fL (ref 78.0–100.0)
Platelets: 166 10*3/uL (ref 150–400)
RBC: 5.08 MIL/uL (ref 4.22–5.81)
RDW: 15.1 % (ref 11.5–15.5)
WBC: 7.1 10*3/uL (ref 4.0–10.5)

## 2013-08-04 ENCOUNTER — Telehealth (INDEPENDENT_AMBULATORY_CARE_PROVIDER_SITE_OTHER): Payer: Self-pay

## 2013-08-04 NOTE — Telephone Encounter (Signed)
Patient called in for lab results. I reviewed them and let him know they all came back normal.

## 2016-06-17 DIAGNOSIS — Z0000 Encounter for general adult medical examination without abnormal findings: Secondary | ICD-10-CM | POA: Diagnosis not present

## 2016-07-19 DIAGNOSIS — J069 Acute upper respiratory infection, unspecified: Secondary | ICD-10-CM | POA: Diagnosis not present

## 2016-07-19 DIAGNOSIS — J029 Acute pharyngitis, unspecified: Secondary | ICD-10-CM | POA: Diagnosis not present

## 2017-01-22 DIAGNOSIS — J029 Acute pharyngitis, unspecified: Secondary | ICD-10-CM | POA: Diagnosis not present

## 2017-01-22 DIAGNOSIS — H00011 Hordeolum externum right upper eyelid: Secondary | ICD-10-CM | POA: Diagnosis not present

## 2017-05-23 DIAGNOSIS — M7661 Achilles tendinitis, right leg: Secondary | ICD-10-CM | POA: Diagnosis not present

## 2017-05-23 DIAGNOSIS — H00011 Hordeolum externum right upper eyelid: Secondary | ICD-10-CM | POA: Diagnosis not present

## 2017-05-23 DIAGNOSIS — L299 Pruritus, unspecified: Secondary | ICD-10-CM | POA: Diagnosis not present

## 2017-05-23 DIAGNOSIS — R030 Elevated blood-pressure reading, without diagnosis of hypertension: Secondary | ICD-10-CM | POA: Diagnosis not present

## 2017-06-15 DIAGNOSIS — M461 Sacroiliitis, not elsewhere classified: Secondary | ICD-10-CM | POA: Diagnosis not present

## 2017-07-17 DIAGNOSIS — H0011 Chalazion right upper eyelid: Secondary | ICD-10-CM | POA: Diagnosis not present

## 2017-12-05 DIAGNOSIS — J029 Acute pharyngitis, unspecified: Secondary | ICD-10-CM | POA: Diagnosis not present

## 2017-12-05 DIAGNOSIS — J0100 Acute maxillary sinusitis, unspecified: Secondary | ICD-10-CM | POA: Diagnosis not present

## 2018-01-26 DIAGNOSIS — H6691 Otitis media, unspecified, right ear: Secondary | ICD-10-CM | POA: Diagnosis not present

## 2018-01-26 DIAGNOSIS — H9201 Otalgia, right ear: Secondary | ICD-10-CM | POA: Diagnosis not present

## 2018-02-17 DIAGNOSIS — J343 Hypertrophy of nasal turbinates: Secondary | ICD-10-CM | POA: Diagnosis not present

## 2018-02-17 DIAGNOSIS — J342 Deviated nasal septum: Secondary | ICD-10-CM | POA: Diagnosis not present

## 2018-02-17 DIAGNOSIS — J310 Chronic rhinitis: Secondary | ICD-10-CM | POA: Diagnosis not present

## 2018-02-17 DIAGNOSIS — H9011 Conductive hearing loss, unilateral, right ear, with unrestricted hearing on the contralateral side: Secondary | ICD-10-CM | POA: Diagnosis not present

## 2018-02-17 DIAGNOSIS — H6981 Other specified disorders of Eustachian tube, right ear: Secondary | ICD-10-CM | POA: Diagnosis not present

## 2018-05-29 DIAGNOSIS — L989 Disorder of the skin and subcutaneous tissue, unspecified: Secondary | ICD-10-CM | POA: Diagnosis not present

## 2018-05-29 DIAGNOSIS — L309 Dermatitis, unspecified: Secondary | ICD-10-CM | POA: Diagnosis not present

## 2018-05-29 DIAGNOSIS — R0681 Apnea, not elsewhere classified: Secondary | ICD-10-CM | POA: Diagnosis not present

## 2018-05-29 DIAGNOSIS — L719 Rosacea, unspecified: Secondary | ICD-10-CM | POA: Diagnosis not present

## 2018-07-20 DIAGNOSIS — R109 Unspecified abdominal pain: Secondary | ICD-10-CM | POA: Diagnosis not present

## 2018-08-13 DIAGNOSIS — R0681 Apnea, not elsewhere classified: Secondary | ICD-10-CM | POA: Diagnosis not present

## 2018-08-18 ENCOUNTER — Other Ambulatory Visit: Payer: Self-pay | Admitting: General Surgery

## 2018-08-18 DIAGNOSIS — R109 Unspecified abdominal pain: Secondary | ICD-10-CM

## 2018-08-24 ENCOUNTER — Ambulatory Visit
Admission: RE | Admit: 2018-08-24 | Discharge: 2018-08-24 | Disposition: A | Discharge status: Home / Self Care | Payer: BLUE CROSS/BLUE SHIELD | Source: Ambulatory Visit | Attending: General Surgery | Admitting: General Surgery

## 2018-08-24 DIAGNOSIS — R109 Unspecified abdominal pain: Secondary | ICD-10-CM

## 2018-08-24 DIAGNOSIS — K760 Fatty (change of) liver, not elsewhere classified: Secondary | ICD-10-CM | POA: Diagnosis not present

## 2018-08-24 MED ORDER — IOPAMIDOL (ISOVUE-300) INJECTION 61%
100.0000 mL | Freq: Once | INTRAVENOUS | Status: AC | PRN
  Administered 2018-08-24: 100 mL via INTRAVENOUS

## 2018-09-08 ENCOUNTER — Ambulatory Visit: Payer: Self-pay | Admitting: General Surgery

## 2018-09-18 DIAGNOSIS — J343 Hypertrophy of nasal turbinates: Secondary | ICD-10-CM | POA: Diagnosis not present

## 2018-09-18 DIAGNOSIS — J342 Deviated nasal septum: Secondary | ICD-10-CM | POA: Diagnosis not present

## 2018-09-18 DIAGNOSIS — J3489 Other specified disorders of nose and nasal sinuses: Secondary | ICD-10-CM | POA: Diagnosis not present

## 2018-10-21 NOTE — Pre-Procedure Instructions (Signed)
Jeremiah Jefferson  10/21/2018      [id number] - [place], [place] - [address] [facility] [address] Phone: [phone] Fax: [phone]    Your procedure is scheduled on Fri., Dec. 6, 2019 from 7:30AM-9:30AM  Report to [facility] Admitting Entrance "A" at 5:30AM  Call this number if you have problems the morning of surgery:  [phone]   Remember:  Do not eat after midnight on Dec. 5th  You may drink clear liquids until 3 hours (4:30AM) prior to surgery .  Clear liquids allowed are:    Water, Juice (non-citric and without pulp), Carbonated beverages, Clear Tea, Black Coffee only, Plain Jell-O only, Gatorade and Plain Popsicles only    Take these medicines the morning of surgery with A SIP OF WATER: Loratadine (CLARITIN) and Fluticasone (FLONASE)  If needed: Acetaminophen (TYLENOL)   As of today, stop taking all Other Aspirin Products, Vitamins, Fish oils, and Herbal medications. Also stop all NSAIDS i.e. Advil, Ibuprofen, Motrin, Aleve, Anaprox, Naproxen, BC, Goody Powders, and all Supplements.   Do not wear jewelry.  Do not wear lotions, powders, colognes, or deodorant.  Do not shave 48 hours prior to surgery.  Men may shave face.  Do not bring valuables to the hospital.  [facility] is not responsible for any belongings or valuables.  Contacts, dentures or bridgework may not be worn into surgery.  Leave your suitcase in the car.  After surgery it may be brought to your room.  For patients admitted to the hospital, discharge time will be determined by your treatment team.  Patients discharged the day of surgery will not be allowed to drive home.   Special instructions:   [facility]- Preparing For Surgery  Before surgery, you can play an important role. Because skin is not sterile, your skin needs to be as free of germs as possible. You can reduce the number of germs on your skin by washing with CHG (chlorahexidine  gluconate) Soap before surgery.  CHG is an antiseptic cleaner which kills germs and bonds with the skin to continue killing germs even after washing.    Oral Hygiene is also important to reduce your risk of infection.  Remember - BRUSH YOUR TEETH THE MORNING OF SURGERY WITH YOUR REGULAR TOOTHPASTE  Please do not use if you have an allergy to CHG or antibacterial soaps. If your skin becomes reddened/irritated stop using the CHG.  Do not shave (including legs and underarms) for at least 48 hours prior to first CHG shower. It is OK to shave your face.  Please follow these instructions carefully.   1. Shower the NIGHT BEFORE SURGERY and the MORNING OF SURGERY with CHG.   2. If you chose to wash your hair, wash your hair first as usual with your normal shampoo.  3. After you shampoo, rinse your hair and body thoroughly to remove the shampoo.  4. Use CHG as you would any other liquid soap. You can apply CHG directly to the skin and wash gently with a scrungie or a clean washcloth.   5. Apply the CHG Soap to your body ONLY FROM THE NECK DOWN.  Do not use on open wounds or open sores. Avoid contact with your eyes, ears, mouth and genitals (private parts). Wash Face and genitals (private parts)  with your normal soap.  6. Wash thoroughly, paying special attention to the area where your surgery will be performed.  7. Thoroughly rinse your body  with warm water from the neck down.  8. DO NOT shower/wash with your normal soap after using and rinsing off the CHG Soap.  9. Pat yourself dry with a CLEAN TOWEL.  10. Wear CLEAN PAJAMAS to bed the night before surgery, wear comfortable clothes the morning of surgery  11. Place CLEAN SHEETS on your bed the night of your first shower and DO NOT SLEEP WITH PETS.  Day of Surgery:  Do not apply any deodorants/lotions.  Please wear clean clothes to the hospital/surgery center.   Remember to brush your teeth WITH YOUR REGULAR TOOTHPASTE.  Please read over  the following fact sheets that you were given. Pain Booklet, Coughing and Deep Breathing and Surgical Site Infection Prevention

## 2018-10-23 ENCOUNTER — Encounter (HOSPITAL_COMMUNITY)
Admission: RE | Admit: 2018-10-23 | Discharge: 2018-10-23 | Disposition: A | Discharge status: Home / Self Care | Payer: BLUE CROSS/BLUE SHIELD | Source: Ambulatory Visit | Attending: General Surgery | Admitting: General Surgery

## 2018-10-23 ENCOUNTER — Encounter (HOSPITAL_COMMUNITY): Payer: Self-pay

## 2018-10-23 DIAGNOSIS — Z01812 Encounter for preprocedural laboratory examination: Secondary | ICD-10-CM | POA: Diagnosis not present

## 2018-10-23 LAB — BASIC METABOLIC PANEL
Anion gap: 9 (ref 5–15)
BUN: 11 mg/dL (ref 6–20)
CO2: 25 mmol/L (ref 22–32)
Calcium: 10.1 mg/dL (ref 8.9–10.3)
Chloride: 104 mmol/L (ref 98–111)
Creatinine, Ser: 0.99 mg/dL (ref 0.61–1.24)
GFR calc Af Amer: >60 mL/min (ref >60)
GFR calc non Af Amer: >60 mL/min (ref >60)
Glucose, Bld: 94 mg/dL (ref 70–99)
Potassium: 4.5 mmol/L (ref 3.5–5.1)
Sodium: 138 mmol/L (ref 135–145)

## 2018-10-23 LAB — CBC
HCT: 37.5 % — ABNORMAL LOW (ref 39.0–52.0)
Hemoglobin: 10.4 g/dL — ABNORMAL LOW (ref 13.0–17.0)
MCH: 19.8 pg — ABNORMAL LOW (ref 26.0–34.0)
MCHC: 27.7 g/dL — ABNORMAL LOW (ref 30.0–36.0)
MCV: 71.6 fL — ABNORMAL LOW (ref 80.0–100.0)
Platelets: 178 10*3/uL (ref 150–400)
RBC: 5.24 MIL/uL (ref 4.22–5.81)
RDW: 19.9 % — ABNORMAL HIGH (ref 11.5–15.5)
WBC: 6.3 10*3/uL (ref 4.0–10.5)
nRBC: 0 % (ref 0.0–0.2)

## 2018-10-23 NOTE — Progress Notes (Signed)
PCP - Dr. Antony Contras Cardiologist - denies  Chest x-ray - N/A EKG - N/A Stress Test -denies  ECHO - denies Cardiac Cath - denies  Sleep Study - scheduled sleep study for 10/2018  Aspirin: N/A  Anesthesia review: No  Patient denies shortness of breath, fever, cough and chest pain at PAT appointment   Patient verbalized understanding of instructions that were given to them at the PAT appointment. Patient was also instructed that they will need to review over the PAT instructions again at home before surgery.

## 2018-10-30 ENCOUNTER — Encounter (HOSPITAL_COMMUNITY): Payer: Self-pay | Admitting: Critical Care Medicine

## 2018-10-30 ENCOUNTER — Ambulatory Visit (HOSPITAL_COMMUNITY): Payer: BLUE CROSS/BLUE SHIELD | Admitting: Anesthesiology

## 2018-10-30 ENCOUNTER — Ambulatory Visit (HOSPITAL_COMMUNITY)
Admission: RE | Admit: 2018-10-30 | Discharge: 2018-10-30 | Disposition: A | Discharge status: Home / Self Care | Payer: BLUE CROSS/BLUE SHIELD | Source: Ambulatory Visit | Attending: General Surgery | Admitting: General Surgery

## 2018-10-30 ENCOUNTER — Encounter (HOSPITAL_COMMUNITY)
Admission: RE | Disposition: A | Discharge status: Home / Self Care | Payer: Self-pay | Source: Ambulatory Visit | Attending: General Surgery

## 2018-10-30 DIAGNOSIS — Z881 Allergy status to other antibiotic agents status: Secondary | ICD-10-CM | POA: Insufficient documentation

## 2018-10-30 DIAGNOSIS — Z79899 Other long term (current) drug therapy: Secondary | ICD-10-CM | POA: Insufficient documentation

## 2018-10-30 DIAGNOSIS — K219 Gastro-esophageal reflux disease without esophagitis: Secondary | ICD-10-CM | POA: Diagnosis not present

## 2018-10-30 DIAGNOSIS — Z9049 Acquired absence of other specified parts of digestive tract: Secondary | ICD-10-CM | POA: Insufficient documentation

## 2018-10-30 DIAGNOSIS — K439 Ventral hernia without obstruction or gangrene: Secondary | ICD-10-CM | POA: Diagnosis not present

## 2018-10-30 HISTORY — PX: VENTRAL HERNIA REPAIR: SHX424

## 2018-10-30 HISTORY — PX: INSERTION OF MESH: SHX5868

## 2018-10-30 SURGERY — REPAIR, HERNIA, VENTRAL, LAPAROSCOPIC
Anesthesia: General

## 2018-10-30 MED ORDER — SUGAMMADEX SODIUM 200 MG/2ML IV SOLN
INTRAVENOUS | Status: DC | PRN
  Administered 2018-10-30: 150 mg via INTRAVENOUS

## 2018-10-30 MED ORDER — ONDANSETRON HCL 4 MG/2ML IJ SOLN
INTRAMUSCULAR | Status: AC
  Filled 2018-10-30: qty 2

## 2018-10-30 MED ORDER — LIDOCAINE 2% (20 MG/ML) 5 ML SYRINGE
INTRAMUSCULAR | Status: DC | PRN
  Administered 2018-10-30: 50 mg via INTRAVENOUS

## 2018-10-30 MED ORDER — CHLORHEXIDINE GLUCONATE CLOTH 2 % EX PADS: 6.0000 | MEDICATED_PAD | Freq: Once | CUTANEOUS | Status: DC

## 2018-10-30 MED ORDER — ROCURONIUM BROMIDE 10 MG/ML (PF) SYRINGE
PREFILLED_SYRINGE | INTRAVENOUS | Status: DC | PRN
  Administered 2018-10-30: 50 mg via INTRAVENOUS
  Administered 2018-10-30: 10 mg via INTRAVENOUS
  Administered 2018-10-30: 20 mg via INTRAVENOUS

## 2018-10-30 MED ORDER — OXYCODONE HCL 5 MG/5ML PO SOLN: 5.0000 mg | Freq: Once | ORAL | Status: AC | PRN

## 2018-10-30 MED ORDER — FENTANYL CITRATE (PF) 100 MCG/2ML IJ SOLN
INTRAMUSCULAR | Status: AC
  Filled 2018-10-30: qty 2

## 2018-10-30 MED ORDER — ONDANSETRON HCL 4 MG/2ML IJ SOLN
4.0000 mg | Freq: Once | INTRAMUSCULAR | Status: AC | PRN
  Administered 2018-10-30: 4 mg via INTRAVENOUS

## 2018-10-30 MED ORDER — FENTANYL CITRATE (PF) 250 MCG/5ML IJ SOLN
INTRAMUSCULAR | Status: AC
  Filled 2018-10-30: qty 5

## 2018-10-30 MED ORDER — ACETAMINOPHEN 500 MG PO TABS
1000.0000 mg | ORAL_TABLET | ORAL | Status: AC
  Administered 2018-10-30: 1000 mg via ORAL
  Filled 2018-10-30: qty 2

## 2018-10-30 MED ORDER — MIDAZOLAM HCL 5 MG/5ML IJ SOLN
INTRAMUSCULAR | Status: DC | PRN
  Administered 2018-10-30: 2 mg via INTRAVENOUS

## 2018-10-30 MED ORDER — BUPIVACAINE-EPINEPHRINE 0.25% -1:200000 IJ SOLN
INTRAMUSCULAR | Status: DC | PRN
  Administered 2018-10-30: 12 mL
  Administered 2018-10-30: 18 mL

## 2018-10-30 MED ORDER — PHENYLEPHRINE 40 MCG/ML (10ML) SYRINGE FOR IV PUSH (FOR BLOOD PRESSURE SUPPORT)
PREFILLED_SYRINGE | INTRAVENOUS | Status: DC | PRN
  Administered 2018-10-30: 80 ug via INTRAVENOUS

## 2018-10-30 MED ORDER — LACTATED RINGERS IV SOLN
INTRAVENOUS | Status: DC | PRN
  Administered 2018-10-30 (×2): via INTRAVENOUS

## 2018-10-30 MED ORDER — BUPIVACAINE-EPINEPHRINE (PF) 0.25% -1:200000 IJ SOLN
INTRAMUSCULAR | Status: AC
  Filled 2018-10-30: qty 30

## 2018-10-30 MED ORDER — PROPOFOL 10 MG/ML IV BOLUS
INTRAVENOUS | Status: DC | PRN
  Administered 2018-10-30: 140 mg via INTRAVENOUS

## 2018-10-30 MED ORDER — 0.9 % SODIUM CHLORIDE (POUR BTL) OPTIME
TOPICAL | Status: DC | PRN
  Administered 2018-10-30: 500 mL

## 2018-10-30 MED ORDER — CEFAZOLIN SODIUM-DEXTROSE 2-4 GM/100ML-% IV SOLN
2.0000 g | INTRAVENOUS | Status: AC
  Administered 2018-10-30: 2 g via INTRAVENOUS
  Filled 2018-10-30: qty 100

## 2018-10-30 MED ORDER — HYDROCODONE-ACETAMINOPHEN 5-325 MG PO TABS: 1.0000 | ORAL_TABLET | Freq: Once | ORAL | Status: DC

## 2018-10-30 MED ORDER — ONDANSETRON HCL 4 MG/2ML IJ SOLN
INTRAMUSCULAR | Status: DC | PRN
  Administered 2018-10-30: 4 mg via INTRAVENOUS

## 2018-10-30 MED ORDER — PROPOFOL 10 MG/ML IV BOLUS
INTRAVENOUS | Status: AC
  Filled 2018-10-30: qty 20

## 2018-10-30 MED ORDER — DEXAMETHASONE SODIUM PHOSPHATE 10 MG/ML IJ SOLN
INTRAMUSCULAR | Status: DC | PRN
  Administered 2018-10-30: 10 mg via INTRAVENOUS

## 2018-10-30 MED ORDER — HYDROCODONE-ACETAMINOPHEN 5-325 MG PO TABS: 1.0000 | ORAL_TABLET | Freq: Four times a day (QID) | ORAL | 0 refills | Status: DC | PRN

## 2018-10-30 MED ORDER — OXYCODONE HCL 5 MG PO TABS
5.0000 mg | ORAL_TABLET | Freq: Once | ORAL | Status: AC | PRN
  Administered 2018-10-30: 5 mg via ORAL

## 2018-10-30 MED ORDER — GABAPENTIN 300 MG PO CAPS
300.0000 mg | ORAL_CAPSULE | ORAL | Status: AC
  Administered 2018-10-30: 300 mg via ORAL
  Filled 2018-10-30: qty 1

## 2018-10-30 MED ORDER — OXYCODONE HCL 5 MG PO TABS
ORAL_TABLET | ORAL | Status: AC
  Filled 2018-10-30: qty 1

## 2018-10-30 MED ORDER — EPHEDRINE SULFATE-NACL 50-0.9 MG/10ML-% IV SOSY
PREFILLED_SYRINGE | INTRAVENOUS | Status: DC | PRN
  Administered 2018-10-30: 5 mg via INTRAVENOUS
  Administered 2018-10-30: 10 mg via INTRAVENOUS

## 2018-10-30 MED ORDER — FENTANYL CITRATE (PF) 250 MCG/5ML IJ SOLN
INTRAMUSCULAR | Status: DC | PRN
  Administered 2018-10-30 (×4): 50 ug via INTRAVENOUS

## 2018-10-30 MED ORDER — CELECOXIB 200 MG PO CAPS
200.0000 mg | ORAL_CAPSULE | ORAL | Status: AC
  Administered 2018-10-30: 200 mg via ORAL
  Filled 2018-10-30: qty 1

## 2018-10-30 MED ORDER — MIDAZOLAM HCL 2 MG/2ML IJ SOLN
INTRAMUSCULAR | Status: AC
  Filled 2018-10-30: qty 2

## 2018-10-30 MED ORDER — FENTANYL CITRATE (PF) 100 MCG/2ML IJ SOLN
25.0000 ug | INTRAMUSCULAR | Status: DC | PRN
  Administered 2018-10-30 (×2): 50 ug via INTRAVENOUS
  Administered 2018-10-30: 25 ug via INTRAVENOUS

## 2018-10-30 SURGICAL SUPPLY — 50 items
ADH SKN CLS APL DERMABOND .7 (GAUZE/BANDAGES/DRESSINGS) ×1
BINDER ABDOMINAL 12 ML 46-62 (SOFTGOODS) IMPLANT
BNDG GAUZE ELAST 4 BULKY (GAUZE/BANDAGES/DRESSINGS) IMPLANT
CANISTER SUCT 3000ML PPV (MISCELLANEOUS) IMPLANT
CHLORAPREP W/TINT 26ML (MISCELLANEOUS) ×2 IMPLANT
COVER SURGICAL LIGHT HANDLE (MISCELLANEOUS) ×2 IMPLANT
COVER WAND RF STERILE (DRAPES) ×1 IMPLANT
DERMABOND ADVANCED (GAUZE/BANDAGES/DRESSINGS) ×1
DERMABOND ADVANCED .7 DNX12 (GAUZE/BANDAGES/DRESSINGS) ×1 IMPLANT
DEVICE SECURE STRAP 25 ABSORB (INSTRUMENTS) ×3 IMPLANT
DEVICE TROCAR PUNCTURE CLOSURE (ENDOMECHANICALS) ×2 IMPLANT
DRAPE INCISE IOBAN 66X45 STRL (DRAPES) ×2 IMPLANT
DRAPE LAPAROSCOPIC ABDOMINAL (DRAPES) ×2 IMPLANT
ELECT CAUTERY BLADE 6.4 (BLADE) ×2 IMPLANT
ELECT REM PT RETURN 9FT ADLT (ELECTROSURGICAL) ×2
ELECTRODE REM PT RTRN 9FT ADLT (ELECTROSURGICAL) ×1 IMPLANT
FILTER SMOKE EVAC LAPAROSHD (FILTER) ×1 IMPLANT
GLOVE BIO SURGEON STRL SZ7.5 (GLOVE) ×2 IMPLANT
GLOVE BIOGEL PI IND STRL 7.5 (GLOVE) IMPLANT
GLOVE BIOGEL PI INDICATOR 7.5 (GLOVE) ×1
GLOVE ECLIPSE 7.5 STRL STRAW (GLOVE) ×1 IMPLANT
GOWN STRL REUS W/ TWL LRG LVL3 (GOWN DISPOSABLE) ×3 IMPLANT
GOWN STRL REUS W/TWL LRG LVL3 (GOWN DISPOSABLE) ×6
KIT BASIN OR (CUSTOM PROCEDURE TRAY) ×2 IMPLANT
KIT TURNOVER KIT B (KITS) ×2 IMPLANT
MARKER SKIN DUAL TIP RULER LAB (MISCELLANEOUS) ×2 IMPLANT
MESH VENTRALIGHT ST 6X8 (Mesh Specialty) ×2 IMPLANT
MESH VENTRLGHT ELLIPSE 8X6XMFL (Mesh Specialty) IMPLANT
NDL SPNL 22GX3.5 QUINCKE BK (NEEDLE) ×1 IMPLANT
NEEDLE SPNL 22GX3.5 QUINCKE BK (NEEDLE) ×2 IMPLANT
NS IRRIG 1000ML POUR BTL (IV SOLUTION) ×2 IMPLANT
PAD ARMBOARD 7.5X6 YLW CONV (MISCELLANEOUS) ×4 IMPLANT
PENCIL BUTTON HOLSTER BLD 10FT (ELECTRODE) ×1 IMPLANT
SCISSORS LAP 5X35 DISP (ENDOMECHANICALS) IMPLANT
SET IRRIG TUBING LAPAROSCOPIC (IRRIGATION / IRRIGATOR) IMPLANT
SHEARS HARMONIC ACE PLUS 36CM (ENDOMECHANICALS) ×1 IMPLANT
SLEEVE ENDOPATH XCEL 5M (ENDOMECHANICALS) ×3 IMPLANT
SUT MNCRL AB 4-0 PS2 18 (SUTURE) ×2 IMPLANT
SUT NOVA NAB DX-16 0-1 5-0 T12 (SUTURE) ×3 IMPLANT
SUT VIC AB 3-0 SH 27 (SUTURE) ×2
SUT VIC AB 3-0 SH 27XBRD (SUTURE) ×1 IMPLANT
TOWEL OR 17X24 6PK STRL BLUE (TOWEL DISPOSABLE) ×2 IMPLANT
TOWEL OR 17X26 10 PK STRL BLUE (TOWEL DISPOSABLE) ×1 IMPLANT
TRAY FOLEY CATH SILVER 16FR (SET/KITS/TRAYS/PACK) ×2 IMPLANT
TRAY LAPAROSCOPIC MC (CUSTOM PROCEDURE TRAY) ×2 IMPLANT
TROCAR XCEL BLUNT TIP 100MML (ENDOMECHANICALS) IMPLANT
TROCAR XCEL NON-BLD 11X100MML (ENDOMECHANICALS) IMPLANT
TROCAR XCEL NON-BLD 5MMX100MML (ENDOMECHANICALS) ×2 IMPLANT
TUBING INSUFFLATION (TUBING) ×2 IMPLANT
WATER STERILE IRR 1000ML POUR (IV SOLUTION) ×2 IMPLANT

## 2018-10-30 NOTE — Anesthesia Postprocedure Evaluation (Signed)
Anesthesia Post Note  Patient: TREBOR GALDAMEZ  Procedure(s) Performed: LAPAROSCOPIC VENTRAL HERNIA REPAIR WITH MESH (N/A ) INSERTION OF MESH (N/A )     Patient location during evaluation: PACU Anesthesia Type: General Level of consciousness: awake and alert Pain management: pain level controlled Vital Signs Assessment: post-procedure vital signs reviewed and stable Respiratory status: spontaneous breathing, nonlabored ventilation and respiratory function stable Cardiovascular status: blood pressure returned to baseline and stable Postop Assessment: no apparent nausea or vomiting Anesthetic complications: no    Last Vitals:  Vitals:   10/30/18 1026 10/30/18 1030  BP:  (!) 146/86  Pulse: 90 79  Resp: 15 15  Temp:    SpO2: 100% 100%    Last Pain:  Vitals:   10/30/18 1026  TempSrc:   PainSc: 7                  Wrigley Winborne E Tima Curet

## 2018-10-30 NOTE — Anesthesia Procedure Notes (Signed)
Procedure Name: Intubation Date/Time: 10/30/2018 7:43 AM Performed by: Wilburn Cornelia, CRNA Pre-anesthesia Checklist: Patient identified, Emergency Drugs available, Suction available, Patient being monitored and Timeout performed Patient Re-evaluated:Patient Re-evaluated prior to induction Oxygen Delivery Method: Circle system utilized Preoxygenation: Pre-oxygenation with 100% oxygen Induction Type: IV induction Ventilation: Mask ventilation without difficulty and Oral airway inserted - appropriate to patient size Laryngoscope Size: Mac and 4 Grade View: Grade I Tube type: Oral Tube size: 7.5 mm Number of attempts: 1 Airway Equipment and Method: Stylet Placement Confirmation: ETT inserted through vocal cords under direct vision,  positive ETCO2,  CO2 detector and breath sounds checked- equal and bilateral Secured at: 22 cm Tube secured with: Tape Dental Injury: Teeth and Oropharynx as per pre-operative assessment

## 2018-10-30 NOTE — H&P (Signed)
Jeremiah Jefferson  Location: [facility] Patient #: [id number] DOB: 07-13-1966 Undefined / Language: Cleophus Molt / Race: White Male   History of Present Illness The patient is a 52 year old male who presents with abdominal pain. we are asked to see the patient in consultation by Dr. Sherrlyn Hock to evaluate him for a ventral hernia. The patient is a 52 year old white male who has a history of partial colectomy and colostomy for diverticulitis back in 2014. Over the last few months he has been experiencing what he feels like is a bulge just to the right of the upper portion of the midline incision. He has had some pain associated with it. He has had a couple episodes of nausea but no vomiting. His appetite is good and his bowels are moving regularly. He has been dieting and has lost a significant amount of weight recently as well.    Review of Systems General Not Present- Appetite Loss, Chills, Fatigue, Fever, Night Sweats, Weight Gain and Weight Loss. Note: All other systems negative (unless as noted in HPI & included Review of Systems) Skin Not Present- Change in Wart/Mole, Dryness, Hives, Jaundice, New Lesions, Non-Healing Wounds, Rash and Ulcer. HEENT Not Present- Earache, Hearing Loss, Hoarseness, Nose Bleed, Oral Ulcers, Ringing in the Ears, Seasonal Allergies, Sinus Pain, Sore Throat, Visual Disturbances, Wears glasses/contact lenses and Yellow Eyes. Respiratory Not Present- Bloody sputum, Chronic Cough, Difficulty Breathing, Snoring and Wheezing. Breast Not Present- Breast Mass, Breast Pain, Nipple Discharge and Skin Changes. Cardiovascular Not Present- Chest Pain, Difficulty Breathing Lying Down, Leg Cramps, Palpitations, Rapid Heart Rate, Shortness of Breath and Swelling of Extremities. Gastrointestinal Not Present- Abdominal Pain, Bloating, Bloody Stool, Change in Bowel Habits, Chronic diarrhea, Constipation, Difficulty Swallowing, Excessive gas, Gets full quickly at meals,  Hemorrhoids, Indigestion, Nausea, Rectal Pain and Vomiting. Male Genitourinary Not Present- Blood in Urine, Change in Urinary Stream, Frequency, Impotence, Nocturia, Painful Urination, Urgency and Urine Leakage. Musculoskeletal Not Present- Back Pain, Joint Pain, Joint Stiffness, Muscle Pain, Muscle Weakness and Swelling of Extremities. Neurological Not Present- Decreased Memory, Fainting, Headaches, Numbness, Seizures, Tingling, Tremor, Trouble walking and Weakness. Psychiatric Not Present- Anxiety, Bipolar, Change in Sleep Pattern, Depression, Fearful and Frequent crying. Endocrine Not Present- Cold Intolerance, Excessive Hunger, Hair Changes, Heat Intolerance and New Diabetes. Hematology Not Present- Easy Bruising, Excessive bleeding, Gland problems, HIV and Persistent Infections.   Physical Exam General Mental Status-Alert. General Appearance-Consistent with stated age. Hydration-Well hydrated. Voice-Normal.  Head and Neck Head-normocephalic, atraumatic with no lesions or palpable masses. Trachea-midline. Thyroid Gland Characteristics - normal size and consistency.  Eye Eyeball - Bilateral-Extraocular movements intact. Sclera/Conjunctiva - Bilateral-No scleral icterus.  Chest and Lung Exam Chest and lung exam reveals -quiet, even and easy respiratory effort with no use of accessory muscles and on auscultation, normal breath sounds, no adventitious sounds and normal vocal resonance. Inspection Chest Wall - Normal. Back - normal.  Cardiovascular Cardiovascular examination reveals -normal heart sounds, regular rate and rhythm with no murmurs and normal pedal pulses bilaterally.  Abdomen Note: the abdomen is soft and mostly nontender. The midline incision and old colostomy site have healed nicely. The abdominal wall feels mostly solid with no palpable fascial defects. There is a small round fatty feeling bulge just to the right of midline but this does not  reduce like a hernia. There is some tenderness to palpation of this fat. There is no sign of obstruction.   Neurologic Neurologic evaluation reveals -alert and oriented x 3 with no impairment of recent or  remote memory. Mental Status-Normal.  Musculoskeletal Normal Exam - Left-Upper Extremity Strength Normal and Lower Extremity Strength Normal. Normal Exam - Right-Upper Extremity Strength Normal and Lower Extremity Strength Normal.  Lymphatic Head & Neck  General Head & Neck Lymphatics: Bilateral - Description - Normal. Axillary  General Axillary Region: Bilateral - Description - Normal. Tenderness - Non Tender. Femoral & Inguinal  Generalized Femoral & Inguinal Lymphatics: Bilateral - Description - Normal. Tenderness - Non Tender.    Assessment & Plan  ABDOMINAL PAIN IN MALE (R10.9) Impression: the patient appears to have some abdominal pain near his previous incision. This certainly could be consistent with a ventral hernia although clinically I do not feel a fascial defect on exam. Because of this I think it would be reasonable to evaluate him with a CT scan of the abdomen and pelvis. If he does have a hernia I have discussed with him in detail the different ways that we can fix it and I think he would be a good laparoscopic candidate. I have discussed with him in detail the risks and benefits of the operation as well as some of the technical aspects and he understands. I will call him with the results of the study and we will proceed accordingly. Current Plans CT ABDOMEN AND PELVIS W CONTRAST [phone]) Follow Up - Call [facility] office after tests / studies doneto discuss further plans

## 2018-10-30 NOTE — Progress Notes (Signed)
Patient reports itching on lower abdomen after clipp/prep. Small area 1 in by 3 inch of redness with a razor rash appearance noted at belt level just above skin fold.  Warm moist washcloth applied to remove residual CHG. Message sent to Dr Marlou Starks.

## 2018-10-30 NOTE — Op Note (Signed)
10/30/2018  9:40 AM  PATIENT:  Jeremiah Jefferson  52 y.o. male  PRE-OPERATIVE DIAGNOSIS:  VENTRAL HERNIA  POST-OPERATIVE DIAGNOSIS:  VENTRAL HERNIA  PROCEDURE:  Procedure(s): LAPAROSCOPIC VENTRAL HERNIA REPAIR WITH MESH (N/A) INSERTION OF MESH (N/A)  SURGEON:  Surgeon(s) and Role:    * Jovita Kussmaul, MD - Primary  PHYSICIAN ASSISTANT:   ASSISTANTS: Judyann Munson, RNFA   ANESTHESIA:   local and general  EBL:  minimal   BLOOD ADMINISTERED:none  DRAINS: none   LOCAL MEDICATIONS USED:  MARCAINE     SPECIMEN:  No Specimen  DISPOSITION OF SPECIMEN:  N/A  COUNTS:  YES  TOURNIQUET:  * No tourniquets in log *  DICTATION: .Dragon Dictation   After informed consent was obtained the patient was brought to the operating room and placed in the supine position on the operating table.  After adequate induction of general anesthesia the patient's abdomen was prepped with ChloraPrep, allowed to dry, and draped in usual sterile manner including the use of an Ioban drape.  An appropriate timeout was performed.  A site was chosen in the right upper quadrant to access the abdominal cavity.  This area was infiltrated with quarter percent Marcaine.  A small stab incision was made with a 15 blade knife.  A 5 mm Optiview port and camera were used to bluntly dissected the layers of the abdominal wall under direct vision until access was gained to the abdominal cavity.  The abdomen was then insufflated with carbon dioxide without difficulty.  The camera was placed back through the port and the abdomen was inspected.  There were adhesions of omentum to the anterior abdominal wall.  On general inspection of the rest of the abdomen no other abnormalities were noted.  Another 5 mm port was placed in the right lower quadrant under direct vision and a third 5 mm port was placed on the left mid abdomen under direct vision.  A harmonic scalpel was used to take down the omental adhesions.  There was no bowel  involvement of the adhesions to the abdominal wall.  Once the abdominal wall was mostly clear of any adhesion then we could identify the hernia defects.  He had 3 very small defects along the upper midline incision.  The old ostomy site was completely intact with no evidence of hernia.  At this point a 15 x 20 cm piece of ventral light mesh was chosen and cut to the appropriate size.  The mesh was oriented with the coated side towards the bowel.  Six #1 Novafil stitches were placed at equidistant points around the edge of the mesh.  Next a small incision was made along the upper midline incision with a 15 blade knife.  The incision was carried through the skin and subcutaneous tissue sharply with the electrocautery until the fascia was opened and access was gained to the abdominal cavity.  The hernia defects were only a couple millimeters wide.  Because of the small size of the defects we decided not to extend the fascial incision to try to incorporate the fascial defect.  I did place the mesh then into the abdominal cavity with the appropriate orientation.  The fascial defect was closed with interrupted #1 Novafil stitches.  The abdomen was then insufflated again and the mesh was observed to be in good position.  6 small stab incisions were made at points corresponding to the stitch placement.  A suture passer was then used to bring the tails of each  stitch through the abdominal wall.  Each of the stitches was then cinched down and tied.  The mesh was again observed to be in good apposition to the abdominal wall without any redundancy or gaps.  The areas between the stitches were filled in with a secure strap tacker.  Once this was accomplished the mesh looked very good in the abdominal cavity and the hernia seem well repaired.  The area was examined and found to be hemostatic.  At this point the gas was allowed to escape and the ports were removed.  The incisions were closed with interrupted 4-0 Monocryl  subcuticular stitches.  Dermabond dressings were applied.  The patient tolerated the procedure well.  At the end of the case all needle sponge and instrument counts were correct.  The patient was then awakened and taken to recovery in stable condition.  PLAN OF CARE: Discharge to home after PACU  PATIENT DISPOSITION:  PACU - hemodynamically stable.   Delay start of Pharmacological VTE agent (>24hrs) due to surgical blood loss or risk of bleeding: not applicable

## 2018-10-30 NOTE — Anesthesia Preprocedure Evaluation (Addendum)
Anesthesia Evaluation  Patient identified by MRN, date of birth, ID band Patient awake    Reviewed: Allergy & Precautions, NPO status , Patient's Chart, lab work & pertinent test results  History of Anesthesia Complications Negative for: history of anesthetic complications  Airway Mallampati: II  TM Distance: >3 FB Neck ROM: Full    Dental no notable dental hx.    Pulmonary neg pulmonary ROS,    Pulmonary exam normal        Cardiovascular negative cardio ROS Normal cardiovascular exam     Neuro/Psych negative neurological ROS  negative psych ROS   GI/Hepatic Neg liver ROS, GERD  ,  Endo/Other  negative endocrine ROS  Renal/GU negative Renal ROS  negative genitourinary   Musculoskeletal negative musculoskeletal ROS (+)   Abdominal   Peds  Hematology  (+) anemia ,   Anesthesia Other Findings 52 yo M for lap ventral hernia repair - GERD, seasonal allergies, anemia (Hgb 10.4)  Reproductive/Obstetrics                            Anesthesia Physical Anesthesia Plan  ASA: II  Anesthesia Plan: General   Post-op Pain Management:    Induction: Intravenous  PONV Risk Score and Plan: Ondansetron, Dexamethasone, Midazolam and Treatment may vary due to age or medical condition  Airway Management Planned: Oral ETT  Additional Equipment: None  Intra-op Plan:   Post-operative Plan: Extubation in OR  Informed Consent: I have reviewed the patients History and Physical, chart, labs and discussed the procedure including the risks, benefits and alternatives for the proposed anesthesia with the patient or authorized representative who has indicated his/her understanding and acceptance.     Plan Discussed with:   Anesthesia Plan Comments:        Anesthesia Quick Evaluation

## 2018-10-30 NOTE — Transfer of Care (Signed)
Immediate Anesthesia Transfer of Care Note  Patient: Jeremiah Jefferson  Procedure(s) Performed: LAPAROSCOPIC VENTRAL HERNIA REPAIR WITH MESH (N/A ) INSERTION OF MESH (N/A )  Patient Location: PACU  Anesthesia Type:General  Level of Consciousness: sedated  Airway & Oxygen Therapy: Patient Spontanous Breathing and Patient connected to nasal cannula oxygen  Post-op Assessment: Report given to RN and Post -op Vital signs reviewed and stable  Post vital signs: Reviewed and stable  Last Vitals:  Vitals Value Taken Time  BP 165/90 10/30/2018  9:45 AM  Temp    Pulse 84 10/30/2018  9:46 AM  Resp 13 10/30/2018  9:46 AM  SpO2 98 % 10/30/2018  9:46 AM  Vitals shown include unvalidated device data.  Last Pain:  Vitals:   10/30/18 0630  TempSrc: Oral  PainSc:          Complications: No apparent anesthesia complications

## 2018-11-02 ENCOUNTER — Encounter (HOSPITAL_COMMUNITY): Payer: Self-pay | Admitting: General Surgery

## 2018-11-23 DIAGNOSIS — G4733 Obstructive sleep apnea (adult) (pediatric): Secondary | ICD-10-CM | POA: Diagnosis not present

## 2018-12-15 DIAGNOSIS — H00015 Hordeolum externum left lower eyelid: Secondary | ICD-10-CM | POA: Diagnosis not present

## 2018-12-16 DIAGNOSIS — H01024 Squamous blepharitis left upper eyelid: Secondary | ICD-10-CM | POA: Diagnosis not present

## 2018-12-16 DIAGNOSIS — H01021 Squamous blepharitis right upper eyelid: Secondary | ICD-10-CM | POA: Diagnosis not present

## 2018-12-16 DIAGNOSIS — H00015 Hordeolum externum left lower eyelid: Secondary | ICD-10-CM | POA: Diagnosis not present

## 2018-12-16 DIAGNOSIS — H01022 Squamous blepharitis right lower eyelid: Secondary | ICD-10-CM | POA: Diagnosis not present

## 2019-06-08 DIAGNOSIS — J343 Hypertrophy of nasal turbinates: Secondary | ICD-10-CM | POA: Diagnosis not present

## 2019-06-08 DIAGNOSIS — J310 Chronic rhinitis: Secondary | ICD-10-CM | POA: Diagnosis not present

## 2019-10-07 DIAGNOSIS — Z20828 Contact with and (suspected) exposure to other viral communicable diseases: Secondary | ICD-10-CM | POA: Diagnosis not present

## 2019-10-28 ENCOUNTER — Other Ambulatory Visit: Payer: Self-pay

## 2019-10-28 DIAGNOSIS — Z20822 Contact with and (suspected) exposure to covid-19: Secondary | ICD-10-CM

## 2019-10-30 LAB — NOVEL CORONAVIRUS, NAA: SARS-CoV-2, NAA: NOT DETECTED

## 2019-11-04 DIAGNOSIS — Z20828 Contact with and (suspected) exposure to other viral communicable diseases: Secondary | ICD-10-CM | POA: Diagnosis not present

## 2020-05-02 DIAGNOSIS — Z125 Encounter for screening for malignant neoplasm of prostate: Secondary | ICD-10-CM | POA: Diagnosis not present

## 2020-05-02 DIAGNOSIS — K602 Anal fissure, unspecified: Secondary | ICD-10-CM | POA: Diagnosis not present

## 2020-05-02 DIAGNOSIS — J302 Other seasonal allergic rhinitis: Secondary | ICD-10-CM | POA: Diagnosis not present

## 2020-05-02 DIAGNOSIS — E782 Mixed hyperlipidemia: Secondary | ICD-10-CM | POA: Diagnosis not present

## 2020-05-02 DIAGNOSIS — L309 Dermatitis, unspecified: Secondary | ICD-10-CM | POA: Diagnosis not present

## 2020-05-02 DIAGNOSIS — R030 Elevated blood-pressure reading, without diagnosis of hypertension: Secondary | ICD-10-CM | POA: Diagnosis not present

## 2020-05-02 DIAGNOSIS — Z0000 Encounter for general adult medical examination without abnormal findings: Secondary | ICD-10-CM | POA: Diagnosis not present

## 2020-06-29 DIAGNOSIS — Z20822 Contact with and (suspected) exposure to covid-19: Secondary | ICD-10-CM | POA: Diagnosis not present

## 2020-11-21 DIAGNOSIS — R21 Rash and other nonspecific skin eruption: Secondary | ICD-10-CM | POA: Diagnosis not present

## 2020-11-21 DIAGNOSIS — Z23 Encounter for immunization: Secondary | ICD-10-CM | POA: Diagnosis not present

## 2020-11-21 DIAGNOSIS — H00011 Hordeolum externum right upper eyelid: Secondary | ICD-10-CM | POA: Diagnosis not present

## 2021-02-21 DIAGNOSIS — R300 Dysuria: Secondary | ICD-10-CM | POA: Diagnosis not present

## 2021-05-03 DIAGNOSIS — K602 Anal fissure, unspecified: Secondary | ICD-10-CM | POA: Diagnosis not present

## 2021-05-03 DIAGNOSIS — Z125 Encounter for screening for malignant neoplasm of prostate: Secondary | ICD-10-CM | POA: Diagnosis not present

## 2021-05-03 DIAGNOSIS — Z0000 Encounter for general adult medical examination without abnormal findings: Secondary | ICD-10-CM | POA: Diagnosis not present

## 2021-05-03 DIAGNOSIS — J302 Other seasonal allergic rhinitis: Secondary | ICD-10-CM | POA: Diagnosis not present

## 2021-05-03 DIAGNOSIS — E782 Mixed hyperlipidemia: Secondary | ICD-10-CM | POA: Diagnosis not present

## 2021-05-03 DIAGNOSIS — R7309 Other abnormal glucose: Secondary | ICD-10-CM | POA: Diagnosis not present

## 2021-05-03 DIAGNOSIS — L309 Dermatitis, unspecified: Secondary | ICD-10-CM | POA: Diagnosis not present

## 2021-05-03 DIAGNOSIS — R945 Abnormal results of liver function studies: Secondary | ICD-10-CM | POA: Diagnosis not present

## 2021-05-03 DIAGNOSIS — L821 Other seborrheic keratosis: Secondary | ICD-10-CM | POA: Diagnosis not present

## 2021-06-11 DIAGNOSIS — R945 Abnormal results of liver function studies: Secondary | ICD-10-CM | POA: Diagnosis not present

## 2021-06-12 ENCOUNTER — Other Ambulatory Visit: Payer: Self-pay | Admitting: Family Medicine

## 2021-06-12 DIAGNOSIS — R7989 Other specified abnormal findings of blood chemistry: Secondary | ICD-10-CM

## 2021-06-27 ENCOUNTER — Ambulatory Visit
Admission: RE | Admit: 2021-06-27 | Discharge: 2021-06-27 | Disposition: A | Discharge status: Home / Self Care | Payer: BC Managed Care – PPO | Source: Ambulatory Visit | Attending: Family Medicine | Admitting: Family Medicine

## 2021-06-27 ENCOUNTER — Other Ambulatory Visit: Payer: Self-pay

## 2021-06-27 DIAGNOSIS — R7989 Other specified abnormal findings of blood chemistry: Secondary | ICD-10-CM

## 2021-06-27 DIAGNOSIS — K760 Fatty (change of) liver, not elsewhere classified: Secondary | ICD-10-CM | POA: Diagnosis not present

## 2021-06-27 DIAGNOSIS — R945 Abnormal results of liver function studies: Secondary | ICD-10-CM | POA: Diagnosis not present

## 2021-07-21 DIAGNOSIS — S70261A Insect bite (nonvenomous), right hip, initial encounter: Secondary | ICD-10-CM | POA: Diagnosis not present

## 2021-07-21 DIAGNOSIS — W57XXXA Bitten or stung by nonvenomous insect and other nonvenomous arthropods, initial encounter: Secondary | ICD-10-CM | POA: Diagnosis not present

## 2021-08-07 DIAGNOSIS — R7303 Prediabetes: Secondary | ICD-10-CM | POA: Diagnosis not present

## 2021-09-04 DIAGNOSIS — Z23 Encounter for immunization: Secondary | ICD-10-CM | POA: Diagnosis not present

## 2021-09-17 DIAGNOSIS — U071 COVID-19: Secondary | ICD-10-CM | POA: Diagnosis not present

## 2021-09-17 DIAGNOSIS — J302 Other seasonal allergic rhinitis: Secondary | ICD-10-CM | POA: Diagnosis not present

## 2021-10-09 DIAGNOSIS — R7303 Prediabetes: Secondary | ICD-10-CM | POA: Diagnosis not present

## 2021-11-12 DIAGNOSIS — L918 Other hypertrophic disorders of the skin: Secondary | ICD-10-CM | POA: Diagnosis not present

## 2021-11-12 DIAGNOSIS — L718 Other rosacea: Secondary | ICD-10-CM | POA: Diagnosis not present

## 2021-11-12 DIAGNOSIS — L820 Inflamed seborrheic keratosis: Secondary | ICD-10-CM | POA: Diagnosis not present

## 2021-11-12 DIAGNOSIS — L281 Prurigo nodularis: Secondary | ICD-10-CM | POA: Diagnosis not present

## 2021-11-12 DIAGNOSIS — L821 Other seborrheic keratosis: Secondary | ICD-10-CM | POA: Diagnosis not present

## 2021-11-16 DIAGNOSIS — M545 Low back pain, unspecified: Secondary | ICD-10-CM | POA: Diagnosis not present

## 2021-11-16 DIAGNOSIS — R030 Elevated blood-pressure reading, without diagnosis of hypertension: Secondary | ICD-10-CM | POA: Diagnosis not present

## 2021-11-16 DIAGNOSIS — Z8616 Personal history of COVID-19: Secondary | ICD-10-CM | POA: Diagnosis not present

## 2021-11-16 DIAGNOSIS — H01001 Unspecified blepharitis right upper eyelid: Secondary | ICD-10-CM | POA: Diagnosis not present

## 2021-12-07 DIAGNOSIS — R0981 Nasal congestion: Secondary | ICD-10-CM | POA: Diagnosis not present

## 2021-12-07 DIAGNOSIS — J019 Acute sinusitis, unspecified: Secondary | ICD-10-CM | POA: Diagnosis not present

## 2021-12-07 DIAGNOSIS — R0982 Postnasal drip: Secondary | ICD-10-CM | POA: Diagnosis not present

## 2021-12-07 DIAGNOSIS — R051 Acute cough: Secondary | ICD-10-CM | POA: Diagnosis not present

## 2021-12-16 IMAGING — US US ABDOMEN LIMITED
1 series · 14 of 25 positions shown · non-contrast
Comparison: Abdominopelvic CT 08/24/2018

CLINICAL DATA: Elevated LFTs.

EXAM:
ULTRASOUND ABDOMEN LIMITED RIGHT UPPER QUADRANT

[Series 1: us abdomen limited · 0.15mm/px · 14 of 46 slices shown]
[im 1/46]
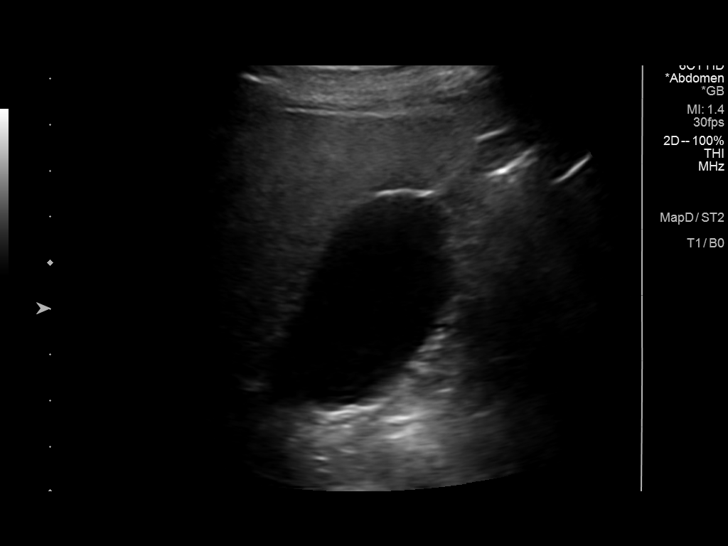
[im 4/46]
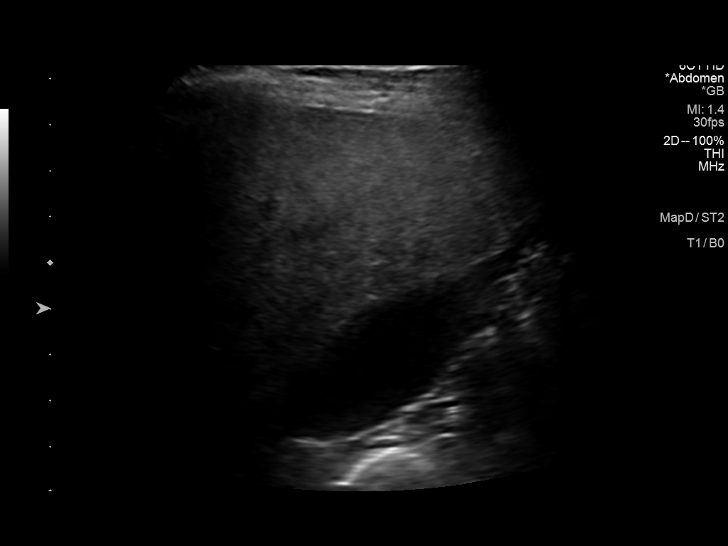
[im 8/46]
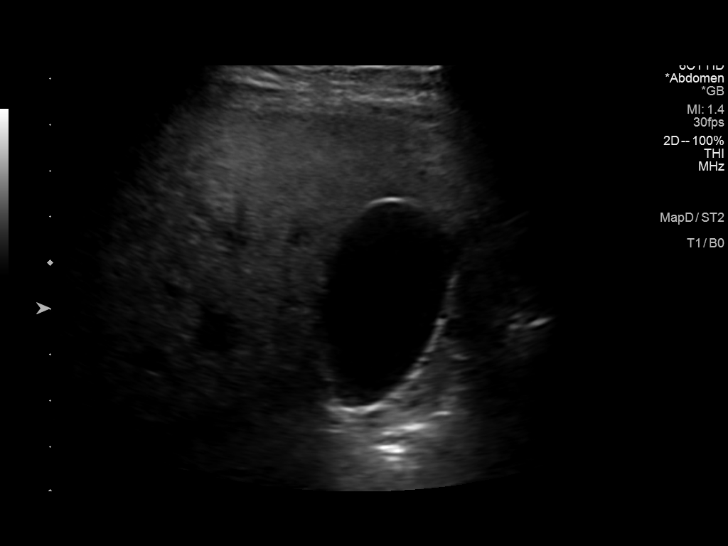
[im 12/46]
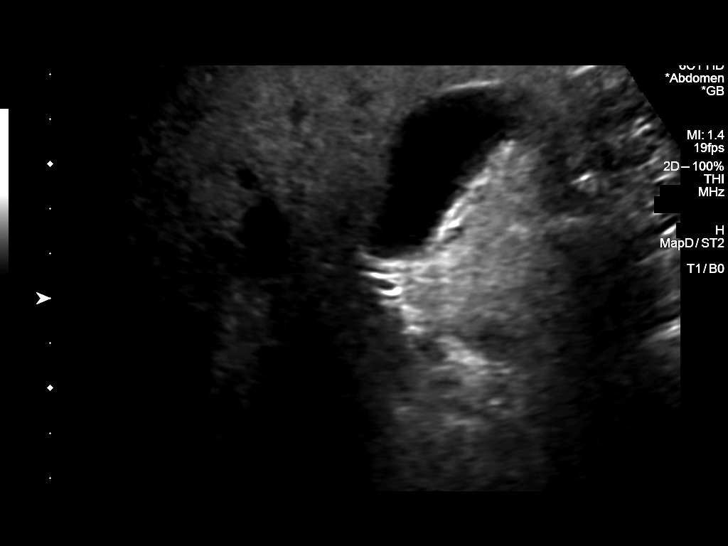
[im 16/46]
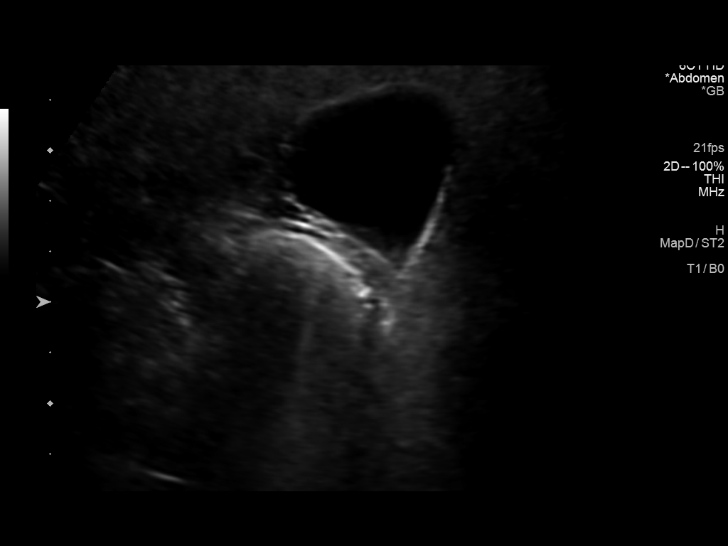
[im 17/46]
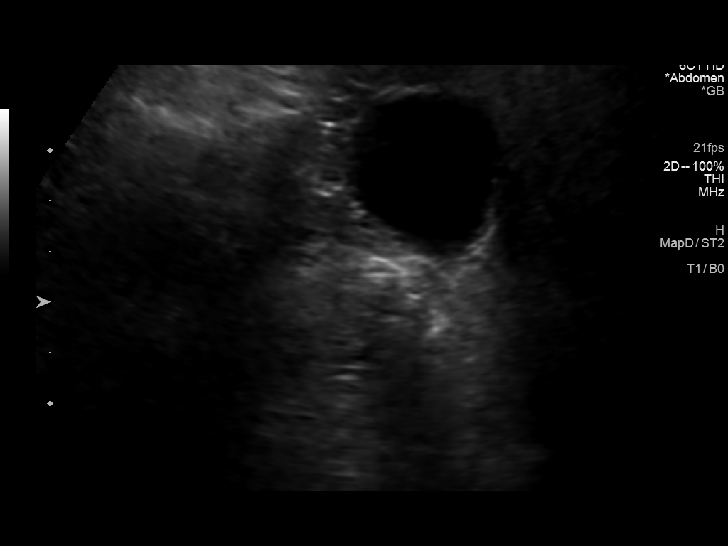
[im 21/46]
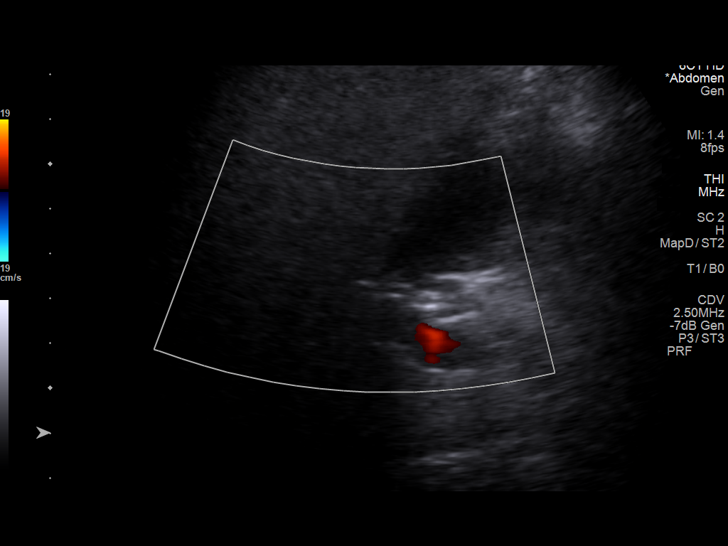
[im 25/46]
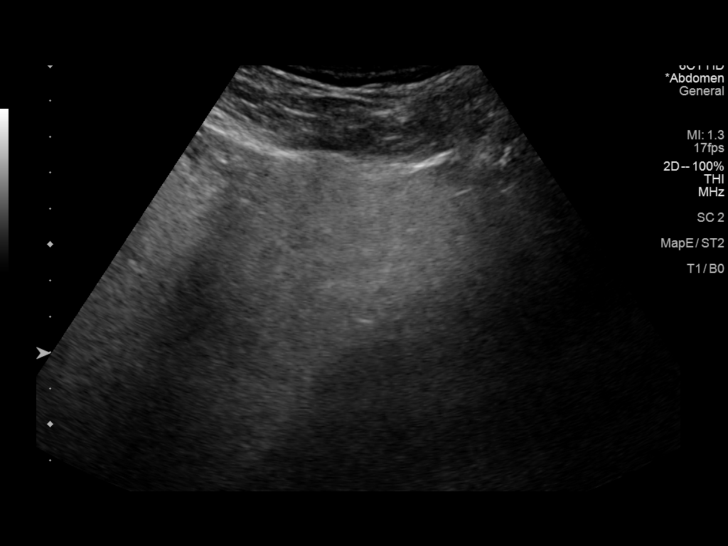
[im 29/46]
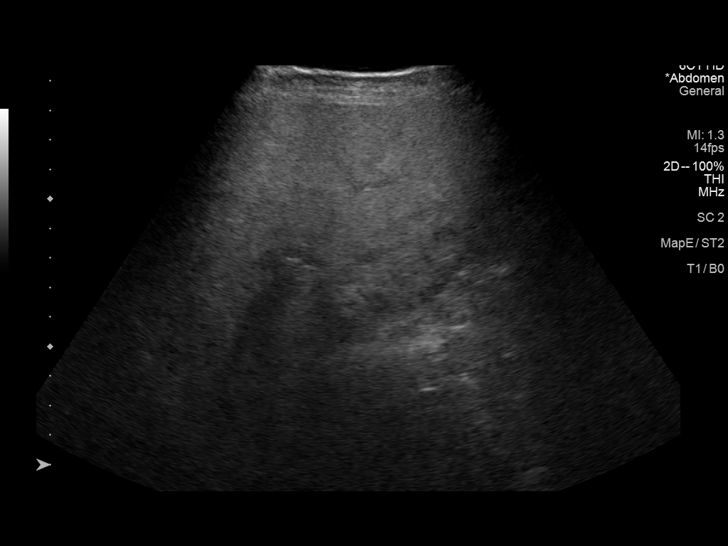
[im 31/46]
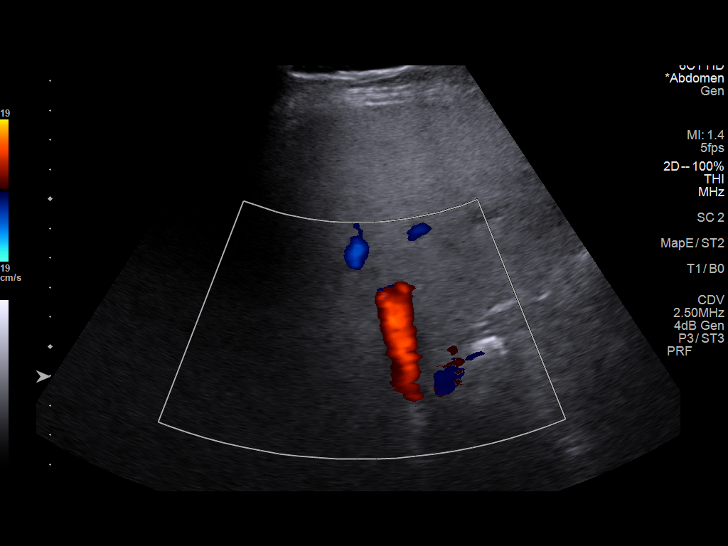
[im 34/46]
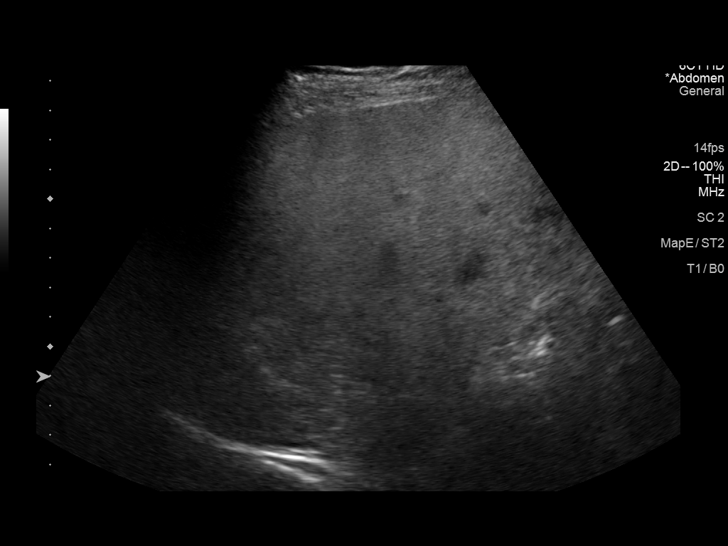
[im 38/46]
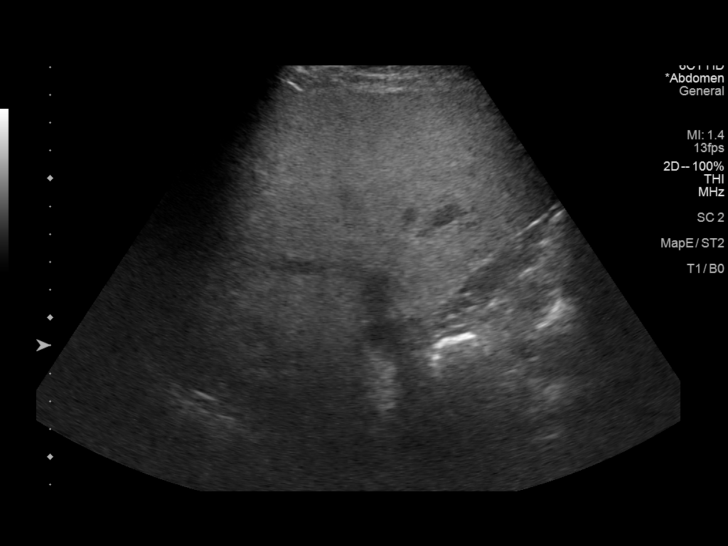
[im 42/46]
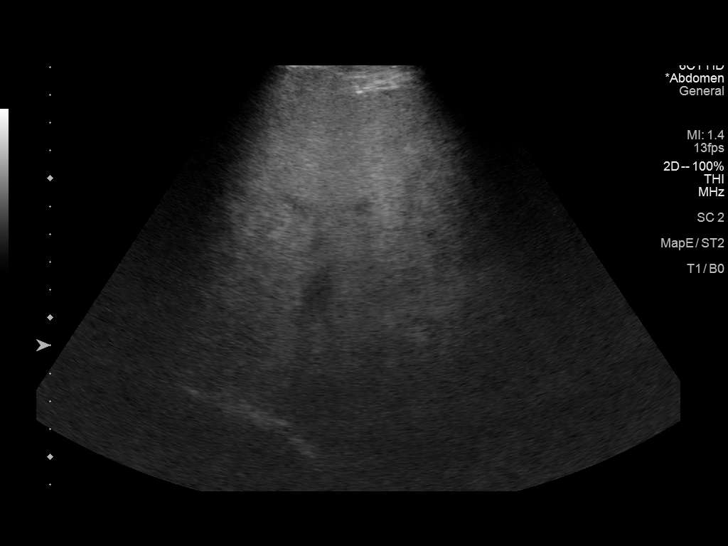
[im 46/46]
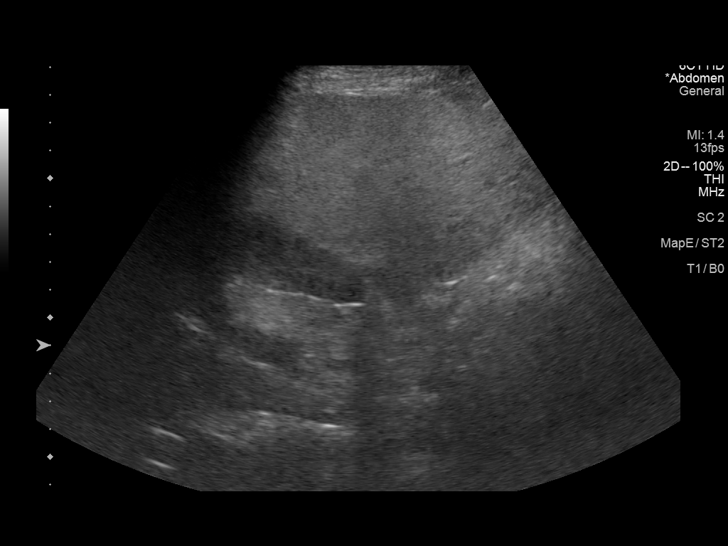

[14 of 25 positions shown; findings below may reference images not displayed]

FINDINGS: Gallbladder:

Physiologically distended. The previous tiny gallstone on CT is not
seen on the current exam. No gallbladder wall thickening. No
sonographic Murphy sign noted by sonographer.

Common bile duct:

Diameter: 2-3 mm, normal.

Liver:

Heterogeneous and diffusely increased in parenchymal echogenicity.
Liver parenchyma is difficult to penetrate. Allowing for this, no
focal lesion is seen. No capsular nodularity. Portal vein is patent
on color Doppler imaging with normal direction of blood flow towards
the liver.

Other: No right upper quadrant ascites.
IMPRESSION: 1. Heterogeneous increased parenchymal echogenicity typical of
steatosis. No focal hepatic lesion.
2. Previous tiny gallstone on CT is not seen on the current exam. No
biliary dilatation or gallbladder wall thickening.

## 2022-04-05 DIAGNOSIS — S63501A Unspecified sprain of right wrist, initial encounter: Secondary | ICD-10-CM | POA: Diagnosis not present

## 2022-04-16 DIAGNOSIS — R7303 Prediabetes: Secondary | ICD-10-CM | POA: Diagnosis not present

## 2022-05-06 DIAGNOSIS — Z125 Encounter for screening for malignant neoplasm of prostate: Secondary | ICD-10-CM | POA: Diagnosis not present

## 2022-05-06 DIAGNOSIS — E782 Mixed hyperlipidemia: Secondary | ICD-10-CM | POA: Diagnosis not present

## 2022-05-06 DIAGNOSIS — Z0000 Encounter for general adult medical examination without abnormal findings: Secondary | ICD-10-CM | POA: Diagnosis not present

## 2022-05-06 DIAGNOSIS — J309 Allergic rhinitis, unspecified: Secondary | ICD-10-CM | POA: Diagnosis not present

## 2022-05-06 DIAGNOSIS — L309 Dermatitis, unspecified: Secondary | ICD-10-CM | POA: Diagnosis not present

## 2022-05-06 DIAGNOSIS — R7303 Prediabetes: Secondary | ICD-10-CM | POA: Diagnosis not present

## 2022-05-10 DIAGNOSIS — M25531 Pain in right wrist: Secondary | ICD-10-CM | POA: Diagnosis not present

## 2022-08-29 DIAGNOSIS — Z881 Allergy status to other antibiotic agents status: Secondary | ICD-10-CM | POA: Diagnosis not present

## 2022-08-29 DIAGNOSIS — N132 Hydronephrosis with renal and ureteral calculous obstruction: Secondary | ICD-10-CM | POA: Diagnosis not present

## 2022-08-29 DIAGNOSIS — D72829 Elevated white blood cell count, unspecified: Secondary | ICD-10-CM | POA: Diagnosis not present

## 2022-08-29 DIAGNOSIS — N400 Enlarged prostate without lower urinary tract symptoms: Secondary | ICD-10-CM | POA: Diagnosis not present

## 2022-08-29 DIAGNOSIS — R109 Unspecified abdominal pain: Secondary | ICD-10-CM | POA: Diagnosis not present

## 2022-08-29 DIAGNOSIS — M549 Dorsalgia, unspecified: Secondary | ICD-10-CM | POA: Diagnosis not present

## 2022-08-29 DIAGNOSIS — R1110 Vomiting, unspecified: Secondary | ICD-10-CM | POA: Diagnosis not present

## 2022-08-29 DIAGNOSIS — Z87442 Personal history of urinary calculi: Secondary | ICD-10-CM | POA: Diagnosis not present

## 2022-09-02 ENCOUNTER — Other Ambulatory Visit: Payer: Self-pay | Admitting: Urology

## 2022-09-02 DIAGNOSIS — N201 Calculus of ureter: Secondary | ICD-10-CM | POA: Diagnosis not present

## 2022-09-03 ENCOUNTER — Encounter (HOSPITAL_BASED_OUTPATIENT_CLINIC_OR_DEPARTMENT_OTHER): Payer: Self-pay | Admitting: Urology

## 2022-09-03 NOTE — Progress Notes (Signed)
Talked with patient. Hx and meds reviewed. Arrival time 1030. NPO after MN except AM meds with sip of water. Instructions given. Driver secured

## 2022-09-05 ENCOUNTER — Encounter (HOSPITAL_BASED_OUTPATIENT_CLINIC_OR_DEPARTMENT_OTHER): Payer: Self-pay | Admitting: Urology

## 2022-09-05 ENCOUNTER — Ambulatory Visit (HOSPITAL_COMMUNITY): Payer: BC Managed Care – PPO

## 2022-09-05 ENCOUNTER — Ambulatory Visit (HOSPITAL_BASED_OUTPATIENT_CLINIC_OR_DEPARTMENT_OTHER)
Admission: RE | Admit: 2022-09-05 | Discharge: 2022-09-05 | Disposition: A | Discharge status: Home / Self Care | Payer: BC Managed Care – PPO | Attending: Urology | Admitting: Urology

## 2022-09-05 ENCOUNTER — Other Ambulatory Visit: Payer: Self-pay

## 2022-09-05 ENCOUNTER — Encounter (HOSPITAL_BASED_OUTPATIENT_CLINIC_OR_DEPARTMENT_OTHER)
Admission: RE | Disposition: A | Discharge status: Home / Self Care | Payer: Self-pay | Source: Home / Self Care | Attending: Urology

## 2022-09-05 DIAGNOSIS — N201 Calculus of ureter: Secondary | ICD-10-CM | POA: Insufficient documentation

## 2022-09-05 DIAGNOSIS — N200 Calculus of kidney: Secondary | ICD-10-CM | POA: Diagnosis not present

## 2022-09-05 HISTORY — DX: Gastro-esophageal reflux disease without esophagitis: K21.9

## 2022-09-05 HISTORY — DX: Prediabetes: R73.03

## 2022-09-05 HISTORY — PX: EXTRACORPOREAL SHOCK WAVE LITHOTRIPSY: SHX1557

## 2022-09-05 HISTORY — DX: Sleep apnea, unspecified: G47.30

## 2022-09-05 SURGERY — LITHOTRIPSY, ESWL
Anesthesia: LOCAL | Laterality: Left

## 2022-09-05 MED ORDER — DIAZEPAM 5 MG PO TABS
10.0000 mg | ORAL_TABLET | ORAL | Status: AC
  Administered 2022-09-05: 10 mg via ORAL

## 2022-09-05 MED ORDER — DIPHENHYDRAMINE HCL 25 MG PO CAPS
ORAL_CAPSULE | ORAL | Status: AC
  Filled 2022-09-05: qty 1

## 2022-09-05 MED ORDER — DIPHENHYDRAMINE HCL 25 MG PO CAPS
25.0000 mg | ORAL_CAPSULE | ORAL | Status: AC
  Administered 2022-09-05: 25 mg via ORAL

## 2022-09-05 MED ORDER — CIPROFLOXACIN HCL 500 MG PO TABS
ORAL_TABLET | ORAL | Status: AC
  Filled 2022-09-05: qty 1

## 2022-09-05 MED ORDER — SODIUM CHLORIDE 0.9 % IV SOLN: INTRAVENOUS | Status: DC

## 2022-09-05 MED ORDER — CIPROFLOXACIN HCL 500 MG PO TABS
500.0000 mg | ORAL_TABLET | ORAL | Status: AC
  Administered 2022-09-05: 500 mg via ORAL

## 2022-09-05 MED ORDER — DIAZEPAM 5 MG PO TABS
ORAL_TABLET | ORAL | Status: AC
  Filled 2022-09-05: qty 2

## 2022-09-05 NOTE — H&P (Signed)
CC/HPI: 56 year old otherwise relatively healthy male presents today for follow-up for a left-sided proximal ureteral stone. The patient was visiting his kids down in [place] and developed flank pain. He was taken to the [facility] where he was evaluated for his pain and diagnosed with a 7 mm left proximal ureteral stone. There is no evidence of infection or kidney injury. His pain was relatively well managed with Toradol and p.o. pain medication. As such she was discharged home. He is here today for further follow-up. He has had some intermittent episodes of intense pain, but continues to feel the pressure constantly. Denies any gross hematuria. Denies any fevers or chills. Is able to tolerate p.o. intake at this time.     ALLERGIES: Erythromycin TABS    MEDICATIONS: Claritin 10 mg tablet  Singulair     GU PSH: Vasectomy - 2012       PSH Notes: Encounter for contraceptive planning, Surgery Of Male Genitalia Vasectomy, Elbow Surgery-1976, Nose Surgery (broken nose)- 1979   NON-GU PSH: Hernia Repair, 2019     GU PMH: None     PMH Notes:  1898-11-25 00:00:00 - Note: Normal Routine History And Physical Adult   NON-GU PMH: Personal history of other diseases of the digestive system, History of esophageal reflux - 2014 GERD Hypercholesterolemia Sleep Apnea    FAMILY HISTORY: 2 daughters - Daughter Diabetes - Mother, Brother Hypertension - Mother, Father Lung Cancer - Father stroke - Grandfather   SOCIAL HISTORY: Marital Status: Married Preferred Language: English; Ethnicity: Not Hispanic Or Latino; Race: White Current Smoking Status: Patient has never smoked.   Tobacco Use Assessment Completed: Used Tobacco in last 30 days? Social Drinker.  Drinks 1 caffeinated drink per day. Patient's occupation Interior and spatial designer.     Notes: Alcohol Use, Occupation:, Marital History - Currently Married, Tobacco Use, Caffeine Use   REVIEW OF SYSTEMS:    GU Review Male:   Patient  reports frequent urination and get up at night to urinate. Patient denies hard to postpone urination, burning/ pain with urination, leakage of urine, stream starts and stops, trouble starting your stream, have to strain to urinate , erection problems, and penile pain.  Gastrointestinal (Upper):   Patient reports nausea and vomiting. Patient denies indigestion/ heartburn.  Gastrointestinal (Lower):   Patient reports constipation. Patient denies diarrhea.  Constitutional:   Patient reports fatigue. Patient denies fever, night sweats, and weight loss.  Skin:   Patient denies skin rash/ lesion and itching.  Eyes:   Patient denies blurred vision and double vision.  Ears/ Nose/ Throat:   Patient denies sore throat and sinus problems.  Hematologic/Lymphatic:   Patient denies swollen glands and easy bruising.  Cardiovascular:   Patient denies leg swelling and chest pains.  Respiratory:   Patient denies cough and shortness of breath.  Endocrine:   Patient denies excessive thirst.  Musculoskeletal:   Patient reports back pain. Patient denies joint pain.  Neurological:   Patient denies headaches and dizziness.  Psychologic:   Patient denies depression and anxiety.   VITAL SIGNS:      09/02/2022 09:33 AM  Weight 160 lb / 72.57 kg  Height 67 in / 170.18 cm  BP 170/94 mmHg  Pulse 59 /min  Temperature 97.7 F / 36.5 C  BMI 25.1 kg/m   MULTI-SYSTEM PHYSICAL EXAMINATION:    Constitutional: Well-nourished. No physical deformities. Normally developed. Good grooming.  Neck: Neck symmetrical, not swollen. Normal tracheal position.  Respiratory: Normal breath sounds. No labored breathing, no use of  accessory muscles.   Cardiovascular: Regular rate and rhythm. No murmur, no gallop. Normal temperature, normal extremity pulses, no swelling, no varicosities.   Lymphatic: No enlargement of neck, axillae, groin.  Skin: No paleness, no jaundice, no cyanosis. No lesion, no ulcer, no rash.  Neurologic / Psychiatric:  Oriented to time, oriented to place, oriented to person. No depression, no anxiety, no agitation.  Gastrointestinal: No mass, no tenderness, no rigidity, non obese abdomen.  Eyes: Normal conjunctivae. Normal eyelids.  Ears, Nose, Mouth, and Throat: Left ear no scars, no lesions, no masses. Right ear no scars, no lesions, no masses. Nose no scars, no lesions, no masses. Normal hearing. Normal lips.  Musculoskeletal: Normal gait and station of head and neck.     Complexity of Data:  Source Of History:  Patient  Records Review:   Previous Doctor Records, Previous Patient Records  Urine Test Review:   Urinalysis  X-Ray Review: C.T. Abdomen: Reviewed Report. Discussed With Patient.     PROCEDURES:         KUB - [id number]  A single view of the abdomen is obtained. Renal shadows are easily visualized bilaterally. There are no stones appreciated within the expected location in either renal pelvis. The patient has a stone overlying L4 his left ureter. There are no additional calcifications along the expected location of either ureter bilaterally.  Gas pattern is grossly normal. No significant bony abnormalities.      . Patient confirmed No Neulasta OnPro Device.           Urinalysis w/Scope Dipstick Dipstick Cont'd Micro  Color: Yellow Bilirubin: Neg mg/dL WBC/hpf: 0 - 5/hpf  Appearance: Clear Ketones: Neg mg/dL RBC/hpf: 3 - 10/hpf  Specific Gravity: 1.015 Blood: 2+ ery/uL Bacteria: Few (10-25/hpf)  pH: 6.0 Protein: Neg mg/dL Cystals: NS (Not Seen)  Glucose: Neg mg/dL Urobilinogen: 0.2 mg/dL Casts: NS (Not Seen)    Nitrites: Neg Trichomonas: Not Present    Leukocyte Esterase: Neg leu/uL Mucous: Present      Epithelial Cells: NS (Not Seen)      Yeast: NS (Not Seen)      Sperm: Not Present    ASSESSMENT:      ICD-10 Details  1 GU:   Ureteral calculus - N20.1    PLAN:            Medications New Meds: Tramadol Hcl 50 mg tablet 1-2 tablet PO Q 6 H PRN   #30  0 Refill(s)  Pharmacy  Name:  [facility]  Address:  [address]   [place], [place] [address]  Phone:  [phone]  Fax:  [phone]            Orders X-Rays: KUB          Schedule Return Visit/Planned Activity: ASAP - Schedule Surgery          Document Letter(s):  Created for Patient: Clinical Summary    We discussed management options including medical expulsion therapy, shockwave lithotripsy, and ureteroscopy. Ultimately, the patient has opted for shock wave lithotripsy. I discussed with the patient the procedure in detail as well as the risk and benefits. The patient is aware that she may need additional procedures. She also is aware of the risks of hematoma and pain. We will try to get this patient's scheduled as soon as possible.         Notes:   Does not have a left proximal ureteral stone overlying L4 on his KUB today. We discussed  management strategies and he like to proceed with left shockwave lithotripsy. He will have to stop the Toradol today, I will call in tramadol for him in the meantime.

## 2022-09-05 NOTE — Op Note (Signed)
See [facility] OP note scanned into chart. Also because of the size, density, location and other factors that cannot be anticipated I feel this will likely be a staged procedure. This fact supersedes any indication in the scanned [facility] operative note to the contrary.  

## 2022-09-05 NOTE — Discharge Instructions (Addendum)
See [facility] discharge instructions in chart.     Post Anesthesia Home Care Instructions  Activity: Get plenty of rest for the remainder of the day. A responsible individual must stay with you for 24 hours following the procedure.  For the next 24 hours, DO NOT: -Drive a car -Paediatric nurse -Drink alcoholic beverages -Take any medication unless instructed by your physician -Make any legal decisions or sign important papers.  Meals: Start with liquid foods such as gelatin or soup. Progress to regular foods as tolerated. Avoid greasy, spicy, heavy foods. If nausea and/or vomiting occur, drink only clear liquids until the nausea and/or vomiting subsides. Call your physician if vomiting continues.  Special Instructions/Symptoms: Your throat may feel dry or sore from the anesthesia or the breathing tube placed in your throat during surgery. If this causes discomfort, gargle with warm salt water. The discomfort should disappear within 24 hours.

## 2022-09-06 ENCOUNTER — Encounter (HOSPITAL_BASED_OUTPATIENT_CLINIC_OR_DEPARTMENT_OTHER): Payer: Self-pay | Admitting: Urology

## 2022-09-13 DIAGNOSIS — R059 Cough, unspecified: Secondary | ICD-10-CM | POA: Diagnosis not present

## 2022-09-13 DIAGNOSIS — R030 Elevated blood-pressure reading, without diagnosis of hypertension: Secondary | ICD-10-CM | POA: Diagnosis not present

## 2022-09-13 DIAGNOSIS — Z87442 Personal history of urinary calculi: Secondary | ICD-10-CM | POA: Diagnosis not present

## 2022-09-13 DIAGNOSIS — Z03818 Encounter for observation for suspected exposure to other biological agents ruled out: Secondary | ICD-10-CM | POA: Diagnosis not present

## 2022-09-13 DIAGNOSIS — J029 Acute pharyngitis, unspecified: Secondary | ICD-10-CM | POA: Diagnosis not present

## 2022-09-19 DIAGNOSIS — N201 Calculus of ureter: Secondary | ICD-10-CM | POA: Diagnosis not present

## 2022-09-27 DIAGNOSIS — H66001 Acute suppurative otitis media without spontaneous rupture of ear drum, right ear: Secondary | ICD-10-CM | POA: Diagnosis not present

## 2022-10-21 DIAGNOSIS — H6690 Otitis media, unspecified, unspecified ear: Secondary | ICD-10-CM | POA: Diagnosis not present

## 2022-10-31 DIAGNOSIS — H6991 Unspecified Eustachian tube disorder, right ear: Secondary | ICD-10-CM | POA: Diagnosis not present

## 2022-11-29 DIAGNOSIS — Z23 Encounter for immunization: Secondary | ICD-10-CM | POA: Diagnosis not present

## 2022-12-03 DIAGNOSIS — D2271 Melanocytic nevi of right lower limb, including hip: Secondary | ICD-10-CM | POA: Diagnosis not present

## 2022-12-03 DIAGNOSIS — L821 Other seborrheic keratosis: Secondary | ICD-10-CM | POA: Diagnosis not present

## 2022-12-03 DIAGNOSIS — D2272 Melanocytic nevi of left lower limb, including hip: Secondary | ICD-10-CM | POA: Diagnosis not present

## 2022-12-03 DIAGNOSIS — D2261 Melanocytic nevi of right upper limb, including shoulder: Secondary | ICD-10-CM | POA: Diagnosis not present

## 2023-01-22 DIAGNOSIS — B349 Viral infection, unspecified: Secondary | ICD-10-CM | POA: Diagnosis not present

## 2023-06-05 DIAGNOSIS — E782 Mixed hyperlipidemia: Secondary | ICD-10-CM | POA: Diagnosis not present

## 2023-06-05 DIAGNOSIS — Z0000 Encounter for general adult medical examination without abnormal findings: Secondary | ICD-10-CM | POA: Diagnosis not present

## 2023-06-05 DIAGNOSIS — J309 Allergic rhinitis, unspecified: Secondary | ICD-10-CM | POA: Diagnosis not present

## 2023-06-05 DIAGNOSIS — L309 Dermatitis, unspecified: Secondary | ICD-10-CM | POA: Diagnosis not present

## 2023-06-05 DIAGNOSIS — Z23 Encounter for immunization: Secondary | ICD-10-CM | POA: Diagnosis not present

## 2023-06-05 DIAGNOSIS — R7303 Prediabetes: Secondary | ICD-10-CM | POA: Diagnosis not present

## 2023-06-05 DIAGNOSIS — Z125 Encounter for screening for malignant neoplasm of prostate: Secondary | ICD-10-CM | POA: Diagnosis not present

## 2023-08-12 ENCOUNTER — Encounter: Payer: Self-pay | Admitting: Internal Medicine

## 2023-08-14 ENCOUNTER — Ambulatory Visit (AMBULATORY_SURGERY_CENTER): Payer: BC Managed Care – PPO | Admitting: *Deleted

## 2023-08-14 VITALS — Ht 67.0 in | Wt 160.0 lb

## 2023-08-14 DIAGNOSIS — Z86010 Personal history of colonic polyps: Secondary | ICD-10-CM

## 2023-08-14 MED ORDER — NA SULFATE-K SULFATE-MG SULF 17.5-3.13-1.6 GM/177ML PO SOLN: 1.0000 | Freq: Once | ORAL | 0 refills | Status: AC

## 2023-08-14 NOTE — Progress Notes (Signed)
Pt's name and DOB verified at the beginning of the pre-visit.  Pt denies any difficulty with ambulating,sitting, laying down or rolling side to side Gave both [facility] main # and MD on call # prior to instructions.  No egg or soy allergy known to patient  No issues known to pt with past sedation with any surgeries or procedures Pt denies having issues being intubated Pt has no issues moving head neck or swallowing No FH of Malignant Hyperthermia Pt is not on diet pills Pt is not on home 02  Pt is not on blood thinners  Pt denies issues with constipation  Pt is not on dialysis Pt denise any abnormal heart rhythms  Pt denies any upcoming cardiac testing Pt encouraged to use to use Singlecare or Goodrx to reduce cost  Patient's chart reviewed by Cathlyn Parsons CNRA prior to pre-visit and patient appropriate for the [facility].  Pre-visit completed and red dot placed by patient's name on their procedure day (on provider's schedule).  . Visit by phone Pt states weight is 160 lb Instructed pt why it is important to and  to call if they have any changes in health or new medications. Directed them to the # given and on instructions.   Pt states they will.  Instructions reviewed with pt and pt states understanding. Instructed to review again prior to procedure. Pt states they will.  Instructions sent by mail with coupon and by my chart

## 2023-08-25 ENCOUNTER — Encounter: Payer: Self-pay | Admitting: Internal Medicine

## 2023-09-01 ENCOUNTER — Ambulatory Visit (AMBULATORY_SURGERY_CENTER): Payer: BC Managed Care – PPO | Admitting: Internal Medicine

## 2023-09-01 ENCOUNTER — Encounter: Payer: Self-pay | Admitting: Internal Medicine

## 2023-09-01 VITALS — BP 122/70 | HR 60 | Temp 98.6°F | Resp 18 | Ht 67.0 in | Wt 160.0 lb

## 2023-09-01 DIAGNOSIS — D128 Benign neoplasm of rectum: Secondary | ICD-10-CM | POA: Diagnosis not present

## 2023-09-01 DIAGNOSIS — D124 Benign neoplasm of descending colon: Secondary | ICD-10-CM

## 2023-09-01 DIAGNOSIS — D127 Benign neoplasm of rectosigmoid junction: Secondary | ICD-10-CM | POA: Diagnosis not present

## 2023-09-01 DIAGNOSIS — D125 Benign neoplasm of sigmoid colon: Secondary | ICD-10-CM | POA: Diagnosis not present

## 2023-09-01 DIAGNOSIS — Z1211 Encounter for screening for malignant neoplasm of colon: Secondary | ICD-10-CM | POA: Diagnosis not present

## 2023-09-01 MED ORDER — SODIUM CHLORIDE 0.9 % IV SOLN: 500.0000 mL | Freq: Once | INTRAVENOUS | Status: DC

## 2023-09-01 NOTE — Progress Notes (Signed)
[facility] History and Physical   Primary Care Physician:  Tally Joe, MD   Reason for Procedure:   CRCa screening  Plan:    colonoscopy     HPI: Jeremiah Jefferson is a 57 y.o. male here for screening exam   Past Medical History:  Diagnosis Date   Allergic rhinitis    Chronic kidney disease    Kidney Stones   Diverticulitis of large intestine with perforation 01/16/2013   GERD (gastroesophageal reflux disease)    Pre-diabetes    Seasonal allergies    Sleep apnea     Past Surgical History:  Procedure Laterality Date   COLONOSCOPY     COLONOSCOPY W/ POLYPECTOMY     COLOSTOMY Left 01/16/2013   Procedure: COLOSTOMY;  Surgeon: Lodema Pilot, DO;  Location: [facility];  Service: General;  Laterality: Left;   COLOSTOMY CLOSURE N/A 06/15/2013   Procedure: COLOSTOMY CLOSURE;  Surgeon: Lodema Pilot, DO;  Location: [facility];  Service: General;  Laterality: N/A;   ELBOW SURGERY Left    EXTRACORPOREAL SHOCK WAVE LITHOTRIPSY Left 09/05/2022   Procedure: LEFT EXTRACORPOREAL SHOCK WAVE LITHOTRIPSY (ESWL);  Surgeon: Noel Christmas, MD;  Location: [facility];  Service: Urology;  Laterality: Left;   INSERTION OF MESH N/A 10/30/2018   Procedure: INSERTION OF MESH;  Surgeon: Griselda Miner, MD;  Location: [facility];  Service: General;  Laterality: N/A;   LAPAROTOMY N/A 01/16/2013   Procedure: EXPLORATORY LAPAROTOMY;  Surgeon: Lodema Pilot, DO;  Location: [facility];  Service: General;  Laterality: N/A;  SIGMOID  COLECTOMY   NOSE SURGERY     x 2   VASECTOMY     VENTRAL HERNIA REPAIR N/A 10/30/2018   Procedure: LAPAROSCOPIC VENTRAL HERNIA REPAIR WITH MESH;  Surgeon: Griselda Miner, MD;  Location: [facility] OR;  Service: General;  Laterality: N/A;    Prior to Admission medications   Medication Sig Start Date End Date Taking? Authorizing Provider  calcium carbonate (TUMS EX) 750 MG chewable tablet Chew 1 tablet by mouth daily as needed for heartburn.   Yes [provider]  cholecalciferol (VITAMIN D3) 25 MCG (1000 UNIT) tablet    Yes [provider]  famotidine (PEPCID) 20 MG tablet Take 20 mg by mouth daily as needed for heartburn or indigestion.   Yes [provider]  fluticasone (FLONASE) 50 MCG/ACT nasal spray Place 2 sprays into the nose daily.   Yes [provider]  loratadine (CLARITIN) 10 MG tablet Take 10 mg by mouth daily.   Yes [provider]  montelukast (SINGULAIR) 10 MG tablet Take 10 mg by mouth at bedtime. 05/14/22  Yes [provider]  Multiple Vitamin (MULTIVITAMIN WITH MINERALS) TABS Take 1 tablet by mouth daily.   Yes [provider]  clobetasol (TEMOVATE) 0.05 % external solution Apply 1 application topically daily as needed. Patient not taking: Reported on 08/14/2023    [provider]  hydrocortisone valerate cream (WESTCORT) 0.2 % Apply 1 application topically daily as needed (itching).     [provider]  mometasone (ELOCON) 0.1 % cream Apply 1 application topically daily as needed (eczema).    [provider]    Current Outpatient Medications  Medication Sig Dispense Refill   calcium carbonate (TUMS EX) 750 MG chewable tablet Chew 1 tablet by mouth daily as needed for heartburn.     cholecalciferol (VITAMIN D3) 25 MCG (1000 UNIT) tablet      famotidine (PEPCID) 20 MG tablet Take 20  mg by mouth daily as needed for heartburn or indigestion.     fluticasone (FLONASE) 50 MCG/ACT nasal spray Place 2 sprays into the nose daily.     loratadine (CLARITIN) 10 MG tablet Take 10 mg by mouth daily.     montelukast (SINGULAIR) 10 MG tablet Take 10 mg by mouth at bedtime.     Multiple Vitamin (MULTIVITAMIN WITH MINERALS) TABS Take 1 tablet by mouth daily.     clobetasol (TEMOVATE) 0.05 % external solution Apply 1 application topically daily as needed. (Patient not taking: Reported on 08/14/2023)     hydrocortisone valerate cream (WESTCORT) 0.2 % Apply 1 application  topically daily as needed (itching).      mometasone (ELOCON) 0.1 % cream Apply 1 application topically daily as needed (eczema).     Current Facility-Administered Medications  Medication Dose Route Frequency Provider Last Rate Last Admin   0.9 %  sodium chloride infusion  500 mL Intravenous Once Iva Boop, MD        Allergies as of 09/01/2023 - Review Complete 09/01/2023  Allergen Reaction Noted   Erythromycin Nausea And Vomiting 01/16/2013    Family History  Problem Relation Age of Onset   Diabetes Mother    Lung cancer Father    Anxiety disorder Sister    Emphysema Sister    Prostate cancer Brother    Diabetes Brother    Colon cancer Neg Hx    Colon polyps Neg Hx    Esophageal cancer Neg Hx    Stomach cancer Neg Hx    Rectal cancer Neg Hx     Social History   Socioeconomic History   Marital status: Married    Spouse name: Not on file   Number of children: Not on file   Years of education: Not on file   Highest education level: Not on file  Occupational History   Not on file  Tobacco Use   Smoking status: Never   Smokeless tobacco: Never  Vaping Use   Vaping status: Never Used  Substance and Sexual Activity   Alcohol use: Yes    Comment: ocassional beer/wine   Drug use: No   Sexual activity: Yes  Other Topics Concern   Not on file  Social History Narrative   Not on file   Social Determinants of Health   Financial Resource Strain: Not on file  Food Insecurity: Not on file  Transportation Needs: Not on file  Physical Activity: Not on file  Stress: Not on file  Social Connections: Not on file  Intimate Partner Violence: Not on file    Review of Systems:  All other review of systems negative except as mentioned in the HPI.  Physical Exam: Vital signs BP 107/69   Pulse 66   Temp 98.6 F (37 C) (Temporal)   Ht 5\' 7"  (1.702 m)   Wt 160 lb (72.6 kg)   SpO2 100%   BMI 25.06 kg/m   General:   Alert,  Well-developed, well-nourished,  pleasant and cooperative in NAD Lungs:  Clear throughout to auscultation.   Heart:  Regular rate and rhythm; no murmurs, clicks, rubs,  or gallops. Abdomen:  Soft, nontender and nondistended. Normal bowel sounds.   Neuro/Psych:  Alert and cooperative. Normal mood and affect. A and O x 3   @Greg Eckrich  Sena Slate, MD, [facility] [phone] (pager) 09/01/2023 8:02 AM@

## 2023-09-01 NOTE — Progress Notes (Signed)
Pt's states no medical or surgical changes since previsit or office visit. 

## 2023-09-01 NOTE — Progress Notes (Signed)
Uneventful anesthetic. Report to pacu rn. Vss. Care resumed by rn. 

## 2023-09-01 NOTE — Progress Notes (Signed)
Called to room to assist during endoscopic procedure.  Patient ID and intended procedure confirmed with present staff. Received instructions for my participation in the procedure from the performing physician.  

## 2023-09-01 NOTE — Patient Instructions (Addendum)
I found and removed 3 tiny polyps that look benign.  Hemorrhoids were somewhat swollen also (common).  I will let you know pathology results and when to have another routine colonoscopy by mail and/or My Chart.  I appreciate the opportunity to care for you. Iva Boop, MD, [facility]  Resume previous diet Continue present medications Await pathology results  Handouts/information given for polyps and hemorrhoids  YOU HAD AN ENDOSCOPIC PROCEDURE TODAY AT THE [facility]:   Refer to the procedure report that was given to you for any specific questions about what was found during the examination.  If the procedure report does not answer your questions, please call your gastroenterologist to clarify.  If you requested that your care partner not be given the details of your procedure findings, then the procedure report has been included in a sealed envelope for you to review at your convenience later.  YOU SHOULD EXPECT: Some feelings of bloating in the abdomen. Passage of more gas than usual.  Walking can help get rid of the air that was put into your GI tract during the procedure and reduce the bloating. If you had a lower endoscopy (such as a colonoscopy or flexible sigmoidoscopy) you may notice spotting of blood in your stool or on the toilet paper. If you underwent a bowel prep for your procedure, you may not have a normal bowel movement for a few days.  Please Note:  You might notice some irritation and congestion in your nose or some drainage.  This is from the oxygen used during your procedure.  There is no need for concern and it should clear up in a day or so.  SYMPTOMS TO REPORT IMMEDIATELY:  Following lower endoscopy (colonoscopy):  Excessive amounts of blood in the stool  Significant tenderness or worsening of abdominal pains  Swelling of the abdomen that is new, acute  Fever of 100F or higher  For urgent or emergent issues, a gastroenterologist can be reached at any  hour by calling [phone]. Do not use MyChart messaging for urgent concerns.   DIET:  We do recommend a small meal at first, but then you may proceed to your regular diet.  Drink plenty of fluids but you should avoid alcoholic beverages for 24 hours.  ACTIVITY:  You should plan to take it easy for the rest of today and you should NOT DRIVE or use heavy machinery until tomorrow (because of the sedation medicines used during the test).    FOLLOW UP: Our staff will call the number listed on your records the next business day following your procedure.  We will call around 7:15- 8:00 am to check on you and address any questions or concerns that you may have regarding the information given to you following your procedure. If we do not reach you, we will leave a message.     If any biopsies were taken you will be contacted by phone or by letter within the next 1-3 weeks.  Please call us at [phone] if you have not heard about the biopsies in 3 weeks.    SIGNATURES/CONFIDENTIALITY: You and/or your care partner have signed paperwork which will be entered into your electronic medical record.  These signatures attest to the fact that that the information above on your After Visit Summary has been reviewed and is understood.  Full responsibility of the confidentiality of this discharge information lies with you and/or your care-partner.

## 2023-09-01 NOTE — Op Note (Signed)
[facility] Patient Name: Jeremiah Jefferson Procedure Date: 09/01/2023 8:00 AM MRN: [id number] Endoscopist: Iva Boop , MD, [id number] Age: 57 Referring MD:  Date of Birth: [birth year] Gender: Male Account #: [id number] Procedure:                Colonoscopy Indications:              Screening for colorectal malignant neoplasm, Last                            colonoscopy: 2014 Medicines:                Monitored Anesthesia Care Procedure:                Pre-Anesthesia Assessment:                           - Prior to the procedure, a History and Physical                            was performed, and patient medications and                            allergies were reviewed. The patient's tolerance of                            previous anesthesia was also reviewed. The risks                            and benefits of the procedure and the sedation                            options and risks were discussed with the patient.                            All questions were answered, and informed consent                            was obtained. Prior Anticoagulants: The patient has                            taken no anticoagulant or antiplatelet agents. ASA                            Grade Assessment: II - A patient with mild systemic                            disease. After reviewing the risks and benefits,                            the patient was deemed in satisfactory condition to                            undergo the procedure.  After obtaining informed consent, the colonoscope                            was passed under direct vision. Throughout the                            procedure, the patient's blood pressure, pulse, and                            oxygen saturations were monitored continuously. The                            CF [id number] #[id number] was introduced through the anus                            and advanced to the the cecum,  identified by                            appendiceal orifice and ileocecal valve. The                            colonoscopy was performed without difficulty. The                            patient tolerated the procedure well. The quality                            of the bowel preparation was excellent. The                            ileocecal valve, appendiceal orifice, and rectum                            were photographed. The bowel preparation used was                            SUPREP via split dose instruction. Scope In: 8:10:29 AM Scope Out: 8:20:46 AM Scope Withdrawal Time: 0 hours 8 minutes 31 seconds  Total Procedure Duration: 0 hours 10 minutes 17 seconds  Findings:                 The perianal and digital rectal examinations were                            normal.                           Three sessile polyps were found in the rectum,                            sigmoid colon and descending colon. The polyps were                            diminutive in size. These polyps were removed with  a cold snare. Resection and retrieval were                            complete. Verification of patient identification                            for the specimen was done. Estimated blood loss was                            minimal.                           There was evidence of a prior end-to-end                            colo-colonic anastomosis in the sigmoid colon. This                            was patent and was characterized by healthy                            appearing mucosa. The anastomosis was traversed.                           External and internal hemorrhoids were found.                           The exam was otherwise without abnormality on                            direct and retroflexion views. Complications:            No immediate complications. Estimated Blood Loss:     Estimated blood loss: none. Impression:               - Three  diminutive polyps in the rectum, in the                            sigmoid colon and in the descending colon, removed                            with a cold snare. Resected and retrieved.                           - Patent end-to-end colo-colonic anastomosis,                            characterized by healthy appearing mucosa.                           - External and internal hemorrhoids.                           - The examination was otherwise normal on direct  and retroflexion views. Recommendation:           - Patient has a contact number available for                            emergencies. The signs and symptoms of potential                            delayed complications were discussed with the                            patient. Return to normal activities tomorrow.                            Written discharge instructions were provided to the                            patient.                           - Resume previous diet.                           - Continue present medications.                           - Repeat colonoscopy is recommended for                            surveillance. The colonoscopy date will be                            determined after pathology results from today's                            exam become available for review. Iva Boop, MD 09/01/2023 8:33:51 AM This report has been signed electronically.

## 2023-09-02 ENCOUNTER — Telehealth: Payer: Self-pay | Admitting: *Deleted

## 2023-09-02 NOTE — Telephone Encounter (Signed)
  Follow up Call-     09/01/2023    7:21 AM  Call back number  Post procedure Call Back phone  # [phone]  Permission to leave phone message Yes     Patient questions:  Do you have a fever, pain , or abdominal swelling? No. Pain Score  0 *  Have you tolerated food without any problems? Yes.    Have you been able to return to your normal activities? Yes.    Do you have any questions about your discharge instructions: Diet   No. Medications  No. Follow up visit  No.  Do you have questions or concerns about your Care? No.  Actions: * If pain score is 4 or above: No action needed, pain <4.

## 2023-09-03 LAB — SURGICAL PATHOLOGY

## 2023-09-05 DIAGNOSIS — Z23 Encounter for immunization: Secondary | ICD-10-CM | POA: Diagnosis not present

## 2023-09-07 ENCOUNTER — Encounter: Payer: Self-pay | Admitting: Internal Medicine

## 2023-09-07 DIAGNOSIS — Z860101 Personal history of adenomatous and serrated colon polyps: Secondary | ICD-10-CM | POA: Insufficient documentation

## 2023-09-07 HISTORY — DX: Personal history of adenomatous and serrated colon polyps: Z86.0101

## 2023-11-04 DIAGNOSIS — R7303 Prediabetes: Secondary | ICD-10-CM | POA: Diagnosis not present

## 2024-01-07 DIAGNOSIS — D225 Melanocytic nevi of trunk: Secondary | ICD-10-CM | POA: Diagnosis not present

## 2024-01-07 DIAGNOSIS — L821 Other seborrheic keratosis: Secondary | ICD-10-CM | POA: Diagnosis not present

## 2024-01-07 DIAGNOSIS — D2262 Melanocytic nevi of left upper limb, including shoulder: Secondary | ICD-10-CM | POA: Diagnosis not present

## 2024-01-07 DIAGNOSIS — L812 Freckles: Secondary | ICD-10-CM | POA: Diagnosis not present

## 2024-01-07 DIAGNOSIS — L820 Inflamed seborrheic keratosis: Secondary | ICD-10-CM | POA: Diagnosis not present

## 2024-07-16 DIAGNOSIS — Z0000 Encounter for general adult medical examination without abnormal findings: Secondary | ICD-10-CM | POA: Diagnosis not present

## 2024-07-16 DIAGNOSIS — R7303 Prediabetes: Secondary | ICD-10-CM | POA: Diagnosis not present

## 2024-07-16 DIAGNOSIS — Z125 Encounter for screening for malignant neoplasm of prostate: Secondary | ICD-10-CM | POA: Diagnosis not present

## 2024-07-16 DIAGNOSIS — Z862 Personal history of diseases of the blood and blood-forming organs and certain disorders involving the immune mechanism: Secondary | ICD-10-CM | POA: Diagnosis not present

## 2024-07-16 DIAGNOSIS — J309 Allergic rhinitis, unspecified: Secondary | ICD-10-CM | POA: Diagnosis not present

## 2024-07-16 DIAGNOSIS — Z23 Encounter for immunization: Secondary | ICD-10-CM | POA: Diagnosis not present

## 2024-07-16 DIAGNOSIS — D509 Iron deficiency anemia, unspecified: Secondary | ICD-10-CM | POA: Diagnosis not present

## 2024-07-16 DIAGNOSIS — L309 Dermatitis, unspecified: Secondary | ICD-10-CM | POA: Diagnosis not present

## 2024-07-16 DIAGNOSIS — L719 Rosacea, unspecified: Secondary | ICD-10-CM | POA: Diagnosis not present

## 2024-07-16 DIAGNOSIS — Z8719 Personal history of other diseases of the digestive system: Secondary | ICD-10-CM | POA: Diagnosis not present

## 2024-07-16 DIAGNOSIS — E782 Mixed hyperlipidemia: Secondary | ICD-10-CM | POA: Diagnosis not present

## 2024-07-17 ENCOUNTER — Encounter (HOSPITAL_BASED_OUTPATIENT_CLINIC_OR_DEPARTMENT_OTHER): Payer: Self-pay | Admitting: Family Medicine

## 2024-07-19 ENCOUNTER — Other Ambulatory Visit (HOSPITAL_BASED_OUTPATIENT_CLINIC_OR_DEPARTMENT_OTHER): Payer: Self-pay | Admitting: Family Medicine

## 2024-07-19 DIAGNOSIS — R29890 Loss of height: Secondary | ICD-10-CM

## 2024-07-21 DIAGNOSIS — G4719 Other hypersomnia: Secondary | ICD-10-CM | POA: Diagnosis not present

## 2024-07-21 DIAGNOSIS — G4733 Obstructive sleep apnea (adult) (pediatric): Secondary | ICD-10-CM | POA: Diagnosis not present

## 2024-08-04 DIAGNOSIS — D649 Anemia, unspecified: Secondary | ICD-10-CM | POA: Diagnosis not present

## 2024-08-09 DIAGNOSIS — G4733 Obstructive sleep apnea (adult) (pediatric): Secondary | ICD-10-CM | POA: Diagnosis not present

## 2024-08-10 DIAGNOSIS — G4733 Obstructive sleep apnea (adult) (pediatric): Secondary | ICD-10-CM | POA: Diagnosis not present

## 2024-08-17 DIAGNOSIS — G4733 Obstructive sleep apnea (adult) (pediatric): Secondary | ICD-10-CM | POA: Diagnosis not present

## 2024-08-17 DIAGNOSIS — G4734 Idiopathic sleep related nonobstructive alveolar hypoventilation: Secondary | ICD-10-CM | POA: Diagnosis not present

## 2024-08-17 DIAGNOSIS — D649 Anemia, unspecified: Secondary | ICD-10-CM | POA: Diagnosis not present

## 2024-08-18 DIAGNOSIS — J019 Acute sinusitis, unspecified: Secondary | ICD-10-CM | POA: Diagnosis not present

## 2024-09-11 DIAGNOSIS — G4733 Obstructive sleep apnea (adult) (pediatric): Secondary | ICD-10-CM | POA: Diagnosis not present

## 2024-09-17 ENCOUNTER — Ambulatory Visit (INDEPENDENT_AMBULATORY_CARE_PROVIDER_SITE_OTHER)

## 2024-09-17 ENCOUNTER — Ambulatory Visit (INDEPENDENT_AMBULATORY_CARE_PROVIDER_SITE_OTHER): Admitting: Audiology

## 2024-09-17 VITALS — BP 135/79 | HR 70 | Ht 67.0 in | Wt 161.0 lb

## 2024-09-17 DIAGNOSIS — H608X9 Other otitis externa, unspecified ear: Secondary | ICD-10-CM

## 2024-09-17 DIAGNOSIS — H608X3 Other otitis externa, bilateral: Secondary | ICD-10-CM

## 2024-09-17 DIAGNOSIS — H6991 Unspecified Eustachian tube disorder, right ear: Secondary | ICD-10-CM

## 2024-09-17 DIAGNOSIS — H903 Sensorineural hearing loss, bilateral: Secondary | ICD-10-CM | POA: Diagnosis not present

## 2024-09-17 MED ORDER — MOMETASONE FUROATE 0.1 % EX CREA: 1.0000 | TOPICAL_CREAM | Freq: Every day | CUTANEOUS | 0 refills | Status: AC | PRN

## 2024-09-17 NOTE — Progress Notes (Signed)
 HPI:   Jeremiah Jefferson is a 58 y.o. male who presents as a new patient being seen in consultation for eustachian tube dysfunction.  Patient states that he feels like he is hearing ear.  He states that when his ear is directly pop then his hearing gets significantly better.  He has not used Flonase  daily however he does use it as needed.  He does use NeilMed rinses daily.  He states that he recently had a sleep study and felt that the nasal pillows significantly helped his symptoms.  Patient has a history of septoplasty.   PMH/Meds/All/SocHx/FamHx/ROS: Past Medical History:  Diagnosis Date   Allergic rhinitis    Chronic kidney disease    Kidney Stones   Diverticulitis of large intestine with perforation 01/16/2013   GERD (gastroesophageal reflux disease)    Hx of adenomatous colonic polyps 09/07/2023   08/2023 3 diminutive adenomas recall 2029    Pre-diabetes    Seasonal allergies    Sleep apnea    Past Surgical History:  Procedure Laterality Date   COLONOSCOPY     COLONOSCOPY W/ POLYPECTOMY     COLOSTOMY Left 01/16/2013   Procedure: COLOSTOMY;  Surgeon: Redell Faith, DO;  Location: [facility];  Service: General;  Laterality: Left;   COLOSTOMY CLOSURE N/A 06/15/2013   Procedure: COLOSTOMY CLOSURE;  Surgeon: Redell Faith, DO;  Location: [facility];  Service: General;  Laterality: N/A;   ELBOW SURGERY Left    EXTRACORPOREAL SHOCK WAVE LITHOTRIPSY Left 09/05/2022   Procedure: LEFT EXTRACORPOREAL SHOCK WAVE LITHOTRIPSY (ESWL);  Surgeon: Elisabeth Valli BIRCH, MD;  Location: [facility];  Service: Urology;  Laterality: Left;   INSERTION OF MESH N/A 10/30/2018   Procedure: INSERTION OF MESH;  Surgeon: Curvin Deward MOULD, MD;  Location: [facility];  Service: General;  Laterality: N/A;   LAPAROTOMY N/A 01/16/2013   Procedure: EXPLORATORY LAPAROTOMY;  Surgeon: Redell Faith, DO;  Location: [facility];  Service: General;  Laterality: N/A;  SIGMOID  COLECTOMY   NOSE SURGERY     x 2   VASECTOMY      VENTRAL HERNIA REPAIR N/A 10/30/2018   Procedure: LAPAROSCOPIC VENTRAL HERNIA REPAIR WITH MESH;  Surgeon: Curvin Deward MOULD, MD;  Location: [facility];  Service: General;  Laterality: N/A;   No family history of bleeding disorders, wound healing problems or difficulty with anesthesia.  Social Connections: Not on file    Current Outpatient Medications:    calcium carbonate (TUMS EX) 750 MG chewable tablet, Chew 1 tablet by mouth daily as needed for heartburn., Disp: , Rfl:    cholecalciferol (VITAMIN D3) 25 MCG (1000 UNIT) tablet, , Disp: , Rfl:    famotidine  (PEPCID ) 20 MG tablet, Take 20 mg by mouth daily as needed for heartburn or indigestion., Disp: , Rfl:    fluticasone  (FLONASE ) 50 MCG/ACT nasal spray, Place 2 sprays into the nose daily., Disp: , Rfl:    hydrocortisone valerate cream (WESTCORT) 0.2 %, Apply 1 application topically daily as needed (itching). , Disp: , Rfl:    loratadine (CLARITIN) 10 MG tablet, Take 10 mg by mouth daily., Disp: , Rfl:    metroNIDAZOLE  (METROGEL ) 0.75 % vaginal gel, apply to the face Externally Once a day; Duration: 30 days, Disp: , Rfl:    mometasone (ELOCON) 0.1 % cream, Apply 1 application topically daily as needed (eczema)., Disp: , Rfl:    montelukast (SINGULAIR) 10 MG tablet, Take 10 mg by mouth at bedtime., Disp: , Rfl:    Multiple Vitamin (  MULTIVITAMIN WITH MINERALS) TABS, Take 1 tablet by mouth daily., Disp: , Rfl:    clobetasol (TEMOVATE) 0.05 % external solution, Apply 1 application topically daily as needed. (Patient not taking: Reported on 09/17/2024), Disp: , Rfl:    Ferrous Sulfate (IRON) 325 (65 Fe) MG TABS, 1 tablet Orally daily, Disp: , Rfl:    Multiple Vitamin (MULTI VITAMIN) TABS, 1 tablet Orally Once a day, Disp: , Rfl:  A complete ROS was performed with pertinent positives/negatives noted in the HPI. The remainder of the ROS are negative.   Physical Exam:  BP 135/79 (BP Location: Right Arm, Patient Position: Sitting, Cuff Size: Normal)    Pulse 70   Ht 5' 7 (1.702 m)   Wt 161 lb (73 kg)   SpO2 93%   BMI 25.22 kg/m  General: Well developed, well nourished. No acute distress. Voice normal Head/Face: Normocephalic. No sinus tenderness. Facial nerve intact and equal bilaterally. No facial lacerations. Eyes: PERRL, no scleral icterus or conjunctival hemorrhage. EOMI. Ears: No gross deformity. Normal external canal. Tympanic membrane intact bilaterally Hearing: Normal speech reception.  Nose: No gross deformity or lesions. No purulent discharge. No turbinate hypertrophy. Mouth/Oropharynx: Lips without any lesions. Dentition good. No mucosal lesions within the oropharynx. No tonsillar enlargement, exudate, or lesions. Pharyngeal walls symmetrical. Uvula midline. Tongue midline without lesions. Larynx: See TFL if applicable Nasopharynx: See TFL if applicable Neck: Trachea midline. No masses. No thyromegaly or nodules palpated. No crepitus. Lymphatic: No lymphadenopathy in the neck. Respiratory: No stridor or distress. Room air. Cardiovascular: Regular rate and rhythm. Extremities: No edema or cyanosis. Warm and well-perfused. Skin: No scars or lesions on face or neck. Neurologic: CN II-XII grossly intact. Moving all extremities without gross abnormality. Other:  Independent Review of Additional Tests or Records: Audiogram 09/17/2024 - Left ear tympanogram type A, Right ear tympanogram showing negative middle ear pressure Bilateral sloping SNHL Procedures: None Impression & Plans: Jeremiah Jefferson is a 58 y.o. male with eustachian tube dysfunction.  Eustachian tube dysfunction R>L - Audiology evaluation today discussed with patient - patient would like to pursue medical management at this time and will consider surgical management in the future - Start Flonase  daily - Start nasal saline rinses daily - Discussed option of eustachian tube dilation - patient would like to pursue conservative management at this  time  Eczematoid otitis externa - Start momentasone ointment to ears once weekly   Follow up as needed  Adah Malkin, DO [facility]

## 2024-09-17 NOTE — Progress Notes (Signed)
  [address], [address] [place], [place] [address] [phone]  Audiological Evaluation    Name: Jeremiah Jefferson     DOB:   25-Apr-1966      MRN:   [id number]                                                                                     Service Date: 09/17/2024     Accompanied by: unaccompanied   Patient comes today after Dr. Mila, ENT sent a referral for a hearing evaluation due to concerns with Eustachian tube dysfunction.   Symptoms Yes Details  Hearing loss  [x]  Feels can hear better when ears pop  Tinnitus  []    Ear pain/ infections/pressure  [x]  Ears pop when he yawns  Balance problems  []    Noise exposure history  [x]  Some power tools, music  Previous ear surgeries  []    Family history of hearing loss  []    Amplification  []    Other  []      Otoscopy: Right ear: Clear external ear canal and notable landmarks visualized on the tympanic membrane. Left ear:  Clear external ear canal and notable landmarks visualized on the tympanic membrane.  Tympanometry: Right ear: Normal external ear canal volume with negative middle ear pressure and reduced tympanic membrane compliance. Findings are suggestive of abnormal middle ear function.  Left ear: Normal external ear canal volume with normal middle ear pressure and tympanic membrane compliance (Type A). Findings are suggestive of normal middle ear function.  Hearing Evaluation The hearing test results were completed under headphones and results are deemed to be of good reliability. Test technique:  conventional    Pure tone Audiometry: Right ear- Norma;l to moderately severe essentially  sensorineural hearing loss from 125 Hz - 8000 Hz. Left ear-  Normal hearing from [phone], except for a mild  sensorineural hearing loss notch from 3000 Hz - 4000 Hz.  Speech Audiometry: Right ear- Speech Reception Threshold (SRT) was obtained at 20 dBHL. Left ear-Speech Reception Threshold (SRT) was obtained at 20 dBHL.   Word  Recognition Score Tested using NU-6 (recorded) Right ear: 100% was obtained at a presentation level of 60 dBHL with contralateral masking which is deemed as  excellent. Left ear: 100% was obtained at a presentation level of 60 dBHL with contralateral masking which is deemed as  excellent.   Impression: There is a significant difference in pure-tone thresholds between ear, right is worse from 6000-8000 Hz. Unclear if it is due to abnormal middle ear function.   Recommendations: Follow up with ENT as scheduled for today.  Repeat audiogram after medical care.   Melbert Botelho MARIE LEROUX-MARTINEZ, AUD

## 2024-09-22 ENCOUNTER — Encounter: Payer: Self-pay | Admitting: Audiology

## 2024-09-29 DIAGNOSIS — R030 Elevated blood-pressure reading, without diagnosis of hypertension: Secondary | ICD-10-CM | POA: Diagnosis not present

## 2024-09-29 DIAGNOSIS — T7840XA Allergy, unspecified, initial encounter: Secondary | ICD-10-CM | POA: Diagnosis not present

## 2024-10-04 NOTE — Progress Notes (Unsigned)
 Chief Complaint: Guaiac positive stools and iron deficiency anemia  HPI:    Jeremiah Jefferson is a 58 year old male, known to Dr. Avram, with a past medical history as listed below including GERD and CKD, who was referred to me by Seabron Lenis, MD for a complaint of guiac positive stools.      09/01/2023 colonoscopy with 3 diminutive polyps in the rectum, sigmoid colon and descending colon, patent end-to-end colocolonic anastomosis and external and internal hemorrhoids.  Pathology showed tubular adenoma.  Repeat recommended in 5 years.    07/16/2024 CBC with a hemoglobin of 11.3, MCV low at 64.1, MCH low at 20.5, RDW high at 23.4, platelets normal.  CMP normal.  Ferritin low at 4.6.  Patient started on ferrous sulfate.    08/04/2024 FOBT positive x 3.    Today, the patient tells me that he is a lifetime blood donor.  Often giving 2 units of red cells when his hemoglobin is high enough.  Recently though he was told his hemoglobin was too low to give blood which prompted all of his workup as above.  He also describes that he has changed his diet eating way less red meat and spinach, and wonders if this is what caused all of his issues.  He has been on ferrous sulfate since August.  Does tell me he had a recheck of his labs which I cannot see, about a month after they were initially checked, and they were some better per him.  He has not seen any blood in his stool but does know that his stools were positive for blood.  Occasional heartburn and reflux if he eats too much food.    Denies fever, chills or weight loss.     Past Medical History:  Diagnosis Date   Allergic rhinitis    Chronic kidney disease    Kidney Stones   Diverticulitis of large intestine with perforation 01/16/2013   GERD (gastroesophageal reflux disease)    Hx of adenomatous colonic polyps 09/07/2023   08/2023 3 diminutive adenomas recall 2029    Pre-diabetes    Seasonal allergies    Sleep apnea     Past Surgical History:   Procedure Laterality Date   COLONOSCOPY     COLONOSCOPY W/ POLYPECTOMY     COLOSTOMY Left 01/16/2013   Procedure: COLOSTOMY;  Surgeon: Redell Faith, DO;  Location: [facility];  Service: General;  Laterality: Left;   COLOSTOMY CLOSURE N/A 06/15/2013   Procedure: COLOSTOMY CLOSURE;  Surgeon: Redell Faith, DO;  Location: [facility];  Service: General;  Laterality: N/A;   ELBOW SURGERY Left    EXTRACORPOREAL SHOCK WAVE LITHOTRIPSY Left 09/05/2022   Procedure: LEFT EXTRACORPOREAL SHOCK WAVE LITHOTRIPSY (ESWL);  Surgeon: Elisabeth Valli BIRCH, MD;  Location: [facility];  Service: Urology;  Laterality: Left;   INSERTION OF MESH N/A 10/30/2018   Procedure: INSERTION OF MESH;  Surgeon: Curvin Deward MOULD, MD;  Location: [facility];  Service: General;  Laterality: N/A;   LAPAROTOMY N/A 01/16/2013   Procedure: EXPLORATORY LAPAROTOMY;  Surgeon: Redell Faith, DO;  Location: [facility];  Service: General;  Laterality: N/A;  SIGMOID  COLECTOMY   NOSE SURGERY     x 2   VASECTOMY     VENTRAL HERNIA REPAIR N/A 10/30/2018   Procedure: LAPAROSCOPIC VENTRAL HERNIA REPAIR WITH MESH;  Surgeon: Curvin Deward MOULD, MD;  Location[facility];  Service: General;  Laterality: N/A;    Current Outpatient Medications  Medication Sig Dispense Refill  calcium carbonate (TUMS EX) 750 MG chewable tablet Chew 1 tablet by mouth daily as needed for heartburn.     cholecalciferol (VITAMIN D3) 25 MCG (1000 UNIT) tablet      clobetasol (TEMOVATE) 0.05 % external solution Apply 1 application topically daily as needed. (Patient not taking: Reported on 09/17/2024)     famotidine  (PEPCID ) 20 MG tablet Take 20 mg by mouth daily as needed for heartburn or indigestion.     Ferrous Sulfate (IRON) 325 (65 Fe) MG TABS 1 tablet Orally daily     fluticasone  (FLONASE ) 50 MCG/ACT nasal spray Place 2 sprays into the nose daily.     hydrocortisone valerate cream (WESTCORT) 0.2 % Apply 1 application topically daily as needed (itching).      loratadine  (CLARITIN) 10 MG tablet Take 10 mg by mouth daily.     metroNIDAZOLE  (METROGEL ) 0.75 % vaginal gel apply to the face Externally Once a day; Duration: 30 days     mometasone (ELOCON) 0.1 % cream Apply 1 Application topically daily as needed (eczema). 45 g 0   montelukast (SINGULAIR) 10 MG tablet Take 10 mg by mouth at bedtime.     Multiple Vitamin (MULTI VITAMIN) TABS 1 tablet Orally Once a day     Multiple Vitamin (MULTIVITAMIN WITH MINERALS) TABS Take 1 tablet by mouth daily.     No current facility-administered medications for this visit.    Allergies as of 10/05/2024 - Review Complete 09/17/2024  Allergen Reaction Noted   Erythromycin Nausea And Vomiting 01/16/2013    Family History  Problem Relation Age of Onset   Diabetes Mother    Lung cancer Father    Anxiety disorder Sister    Emphysema Sister    Prostate cancer Brother    Diabetes Brother    Colon cancer Neg Hx    Colon polyps Neg Hx    Esophageal cancer Neg Hx    Stomach cancer Neg Hx    Rectal cancer Neg Hx     Social History   Socioeconomic History   Marital status: Married    Spouse name: Not on file   Number of children: Not on file   Years of education: Not on file   Highest education level: Not on file  Occupational History   Not on file  Tobacco Use   Smoking status: Never   Smokeless tobacco: Never  Vaping Use   Vaping status: Never Used  Substance and Sexual Activity   Alcohol use: Yes    Comment: ocassional beer/wine   Drug use: No   Sexual activity: Yes  Other Topics Concern   Not on file  Social History Narrative   Not on file   Social Drivers of Health   Financial Resource Strain: Not on file  Food Insecurity: Not on file  Transportation Needs: Not on file  Physical Activity: Not on file  Stress: Not on file  Social Connections: Not on file  Intimate Partner Violence: Not on file    Review of Systems:    Constitutional: No weight loss, fever or chills Cardiovascular: No chest  pain Respiratory: No SOB Gastrointestinal: See HPI and otherwise negative   Physical Exam:  Vital signs: BP 136/80   Pulse 92   Ht 5' 6 (1.676 m)   Wt 162 lb 2 oz (73.5 kg)   BMI 26.17 kg/m    Constitutional:   Pleasant Caucasian male appears to be in NAD, Well developed, Well nourished, alert and cooperative Respiratory: Respirations even and  unlabored. Lungs clear to auscultation bilaterally.   No wheezes, crackles, or rhonchi.  Cardiovascular: Normal S1, S2. No MRG. Regular rate and rhythm. No peripheral edema, cyanosis or pallor.  Gastrointestinal:  Soft, nondistended, nontender. No rebound or guarding. Normal bowel sounds. No appreciable masses or hepatomegaly. Rectal:  Not performed.  Psychiatric: Demonstrates good judgement and reason without abnormal affect or behaviors.  RELEVANT LABS AND IMAGING: CBC    Component Value Date/Time   WBC 6.3 10/23/2018 1037   RBC 5.24 10/23/2018 1037   HGB 10.4 (L) 10/23/2018 1037   HCT 37.5 (L) 10/23/2018 1037   PLT 178 10/23/2018 1037   MCV 71.6 (L) 10/23/2018 1037   MCH 19.8 (L) 10/23/2018 1037   MCHC 27.7 (L) 10/23/2018 1037   RDW 19.9 (H) 10/23/2018 1037   LYMPHSABS 1.4 05/12/2013 1233   MONOABS 0.7 05/12/2013 1233   EOSABS 0.1 05/12/2013 1233   BASOSABS 0.1 05/12/2013 1233    CMP     Component Value Date/Time   NA 138 10/23/2018 1037   K 4.5 10/23/2018 1037   CL 104 10/23/2018 1037   CO2 25 10/23/2018 1037   GLUCOSE 94 10/23/2018 1037   BUN 11 10/23/2018 1037   CREATININE 0.99 10/23/2018 1037   CALCIUM 10.1 10/23/2018 1037   PROT 7.6 01/16/2013 1800   ALBUMIN 3.9 01/16/2013 1800   AST 54 (H) 01/16/2013 1800   ALT 74 (H) 01/16/2013 1800   ALKPHOS 84 01/16/2013 1800   BILITOT 0.5 01/16/2013 1800   GFRNONAA >60 10/23/2018 1037   GFRAA >60 10/23/2018 1037    Assessment: 1.  Iron deficiency anemia: With guaiac positive stools, patient is a regular blood donor, unable to give blood recently with a decrease in  hemoglobin below 13, has changed diet with less red meat and spinach, recently started on ferrous sulfate by PCP, recent colonoscopy last year as in HPI; consider relation of blood donation most likely +/- upper GI bleed  Plan: 1.  Scheduled patient for diagnostic EGD given iron deficiency anemia and very recent colonoscopy.  This was scheduled with Dr. Avram in the [facility].  Did provide the patient with a detailed list of risks for the procedure and he agrees to proceed. 2.  Recheck labs today including a CBC, CMP and iron studies with ferritin as well as B12 and folate. 3.  Continue iron supplementation. 3.  Patient to follow in clinic per recommendations from Dr. Avram after time of procedure.  Delon Failing, PA-C [facility] 10/04/2024, 9:29 AM  Cc: Seabron Lenis, MD

## 2024-10-05 ENCOUNTER — Encounter: Payer: Self-pay | Admitting: Physician Assistant

## 2024-10-05 ENCOUNTER — Ambulatory Visit: Admitting: Physician Assistant

## 2024-10-05 ENCOUNTER — Ambulatory Visit: Payer: Self-pay | Admitting: Physician Assistant

## 2024-10-05 ENCOUNTER — Other Ambulatory Visit (INDEPENDENT_AMBULATORY_CARE_PROVIDER_SITE_OTHER)

## 2024-10-05 VITALS — BP 136/80 | HR 92 | Ht 66.0 in | Wt 162.1 lb

## 2024-10-05 DIAGNOSIS — D509 Iron deficiency anemia, unspecified: Secondary | ICD-10-CM

## 2024-10-05 DIAGNOSIS — R195 Other fecal abnormalities: Secondary | ICD-10-CM

## 2024-10-05 LAB — COMPREHENSIVE METABOLIC PANEL WITH GFR
ALT: 90 U/L — ABNORMAL HIGH (ref 0–53)
AST: 34 U/L (ref 0–37)
Albumin: 4.4 g/dL (ref 3.5–5.2)
Alkaline Phosphatase: 88 U/L (ref 39–117)
BUN: 16 mg/dL (ref 6–23)
CO2: 30 meq/L (ref 19–32)
Calcium: 10.5 mg/dL (ref 8.4–10.5)
Chloride: 100 meq/L (ref 96–112)
Creatinine, Ser: 1.16 mg/dL (ref 0.40–1.50)
GFR: 68.95 mL/min (ref >60.00)
Glucose, Bld: 96 mg/dL (ref 70–99)
Potassium: 3.6 meq/L (ref 3.5–5.1)
Sodium: 140 meq/L (ref 135–145)
Total Bilirubin: 0.8 mg/dL (ref 0.2–1.2)
Total Protein: 7.3 g/dL (ref 6.0–8.3)

## 2024-10-05 LAB — CBC WITH DIFFERENTIAL/PLATELET
Basophils Absolute: 0.1 K/uL (ref 0.0–0.1)
Basophils Relative: 0.7 % (ref 0.0–3.0)
Eosinophils Absolute: 0.2 K/uL (ref 0.0–0.7)
Eosinophils Relative: 2.1 % (ref 0.0–5.0)
HCT: 50.7 % (ref 39.0–52.0)
Hemoglobin: 17.2 g/dL — ABNORMAL HIGH (ref 13.0–17.0)
Lymphocytes Relative: 17.5 % (ref 12.0–46.0)
Lymphs Abs: 1.8 K/uL (ref 0.7–4.0)
MCHC: 34 g/dL (ref 30.0–36.0)
MCV: 78.9 fl (ref 78.0–100.0)
Monocytes Absolute: 1 K/uL (ref 0.1–1.0)
Monocytes Relative: 9.3 % (ref 3.0–12.0)
Neutro Abs: 7.4 K/uL (ref 1.4–7.7)
Neutrophils Relative %: 70.4 % (ref 43.0–77.0)
Platelets: 163 K/uL (ref 150.0–400.0)
RBC: 6.42 Mil/uL — ABNORMAL HIGH (ref 4.22–5.81)
RDW: 23.3 % — ABNORMAL HIGH (ref 11.5–15.5)
WBC: 10.6 K/uL — ABNORMAL HIGH (ref 4.0–10.5)

## 2024-10-05 LAB — IBC + FERRITIN
Ferritin: 21.6 ng/mL — ABNORMAL LOW (ref 22.0–322.0)
Iron: 95 ug/dL (ref 42–165)
Saturation Ratios: 21.1 % (ref 20.0–50.0)
TIBC: 450.8 ug/dL — ABNORMAL HIGH (ref 250.0–450.0)
Transferrin: 322 mg/dL (ref 212.0–360.0)

## 2024-10-05 LAB — B12 AND FOLATE PANEL
Folate: >23.7 ng/mL (ref >5.9)
Vitamin B-12: 285 pg/mL (ref 211–911)

## 2024-10-05 NOTE — Patient Instructions (Addendum)
 Your provider has requested that you go to the basement level for lab work before leaving today. Press B on the elevator. The lab is located at the first door on the left as you exit the elevator.  It has been recommended to you by your physician that you have a(n) upper endoscopy completed. Per your request, we did not schedule the procedure(s) today. Please contact our office at [phone] should you decide to have the procedure completed. You will be scheduled for a pre-visit and procedure at that time.  Continue iron supplement.

## 2024-10-06 NOTE — Addendum Note (Signed)
 Addended by: WILL POWELL CROME on: 10/06/2024 09:08 AM   Modules accepted: Orders

## 2024-10-14 DIAGNOSIS — G4733 Obstructive sleep apnea (adult) (pediatric): Secondary | ICD-10-CM | POA: Diagnosis not present

## 2024-10-14 DIAGNOSIS — R400 Somnolence: Secondary | ICD-10-CM | POA: Diagnosis not present

## 2024-10-27 ENCOUNTER — Encounter: Payer: Self-pay | Admitting: Internal Medicine

## 2024-10-27 ENCOUNTER — Other Ambulatory Visit

## 2024-10-27 ENCOUNTER — Ambulatory Visit: Admitting: Internal Medicine

## 2024-10-27 VITALS — BP 122/77 | HR 60 | Temp 97.6°F | Resp 12 | Ht 66.0 in | Wt 162.0 lb

## 2024-10-27 DIAGNOSIS — R195 Other fecal abnormalities: Secondary | ICD-10-CM | POA: Diagnosis not present

## 2024-10-27 DIAGNOSIS — D509 Iron deficiency anemia, unspecified: Secondary | ICD-10-CM

## 2024-10-27 DIAGNOSIS — Z52008 Unspecified donor, other blood: Secondary | ICD-10-CM | POA: Insufficient documentation

## 2024-10-27 LAB — CBC
HCT: 43.9 % (ref 39.0–52.0)
Hemoglobin: 14.8 g/dL (ref 13.0–17.0)
MCHC: 33.8 g/dL (ref 30.0–36.0)
MCV: 82.2 fl (ref 78.0–100.0)
Platelets: 143 K/uL — ABNORMAL LOW (ref 150.0–400.0)
RBC: 5.34 Mil/uL (ref 4.22–5.81)
RDW: 18.8 % — ABNORMAL HIGH (ref 11.5–15.5)
WBC: 5.2 K/uL (ref 4.0–10.5)

## 2024-10-27 MED ORDER — SODIUM CHLORIDE 0.9 % IV SOLN: 500.0000 mL | INTRAVENOUS | Status: DC

## 2024-10-27 NOTE — Op Note (Signed)
 [facility] Patient Name: Jeremiah Jefferson Procedure Date: 10/27/2024 2:42 PM MRN: [id number] Endoscopist: Lupita FORBES Commander , MD, [id number] Age: 58 Referring MD:  Date of Birth: 05/03/1966 Gender: Male Account #: [id number] Procedure:                Upper GI endoscopy Indications:              Iron deficiency anemia, Heme positive stool Medicines:                Monitored Anesthesia Care Procedure:                Pre-Anesthesia Assessment:                           - Prior to the procedure, a History and Physical                            was performed, and patient medications and                            allergies were reviewed. The patient's tolerance of                            previous anesthesia was also reviewed. The risks                            and benefits of the procedure and the sedation                            options and risks were discussed with the patient.                            All questions were answered, and informed consent                            was obtained. Prior Anticoagulants: The patient has                            taken no anticoagulant or antiplatelet agents. ASA                            Grade Assessment: II - A patient with mild systemic                            disease. After reviewing the risks and benefits,                            the patient was deemed in satisfactory condition to                            undergo the procedure.                           After obtaining informed consent, the endoscope was  passed under direct vision. Throughout the                            procedure, the patient's blood pressure, pulse, and                            oxygen saturations were monitored continuously. The                            GIF [id number] #[id number] was introduced through the                            mouth, and advanced to the second part of duodenum.                            The upper GI  endoscopy was accomplished without                            difficulty. The patient tolerated the procedure                            well. Scope In: Scope Out: Findings:                 The esophagus was normal.                           The stomach was normal.                           The examined duodenum was normal.                           The cardia and gastric fundus were normal on                            retroflexion. Complications:            No immediate complications. Estimated Blood Loss:     Estimated blood loss: none. Impression:               - Normal esophagus.                           - Normal stomach.                           - Normal examined duodenum.                           - No specimens collected. No cause of iron                            deficiency or heme + stool - I think blood product                            donation is cause of the iron deficiency and  hemorrhoids cause of heme + stool (had colonoscopy                            last year) Recommendation:           - Patient has a contact number available for                            emergencies. The signs and symptoms of potential                            delayed complications were discussed with the                            patient. Return to normal activities tomorrow.                            Written discharge instructions were provided to the                            patient.                           - Resume previous diet.                           - Continue present medications.                           - Sttop donating blood products until iron stores                            are restored - ferritin > 100                           Recheck CBC today Lupita FORBES Commander, MD 10/27/2024 3:06:28 PM This report has been signed electronically.

## 2024-10-27 NOTE — Progress Notes (Signed)
 Pt's states no medical or surgical changes since previsit or office visit.

## 2024-10-27 NOTE — Patient Instructions (Addendum)
 Esophagus stomach and upper intestine (duodenum) all look normal.  No cause of blood loss or iron deficiency.  I think you were iron deficient because of regular blood donation.  I recommend that you stop donating blood or blood products until we know your iron levels are completely restored.  Your hemoglobin was actually a little above normal on November 11.  But if you do not restore your iron levels (they are not restored) you may quickly become anemic again.  Keep taking your iron supplement. I am rechecking your blood count today.  I appreciate the opportunity to care for you. [name], MD, FACG    YOU HAD AN ENDOSCOPIC PROCEDURE TODAY AT THE [facility]:   Refer to the procedure report that was given to you for any specific questions about what was found during the examination.  If the procedure report does not answer your questions, please call your gastroenterologist to clarify.  If you requested that your care partner not be given the details of your procedure findings, then the procedure report has been included in a sealed envelope for you to review at your convenience later.  YOU SHOULD EXPECT: Some feelings of bloating in the abdomen. Passage of more gas than usual.  Walking can help get rid of the air that was put into your GI tract during the procedure and reduce the bloating. If you had a lower endoscopy (such as a colonoscopy or flexible sigmoidoscopy) you may notice spotting of blood in your stool or on the toilet paper. If you underwent a bowel prep for your procedure, you may not have a normal bowel movement for a few days.  Please Note:  You might notice some irritation and congestion in your nose or some drainage.  This is from the oxygen used during your procedure.  There is no need for concern and it should clear up in a day or so.  SYMPTOMS TO REPORT IMMEDIATELY:  Following upper endoscopy (EGD)  Vomiting of blood or coffee ground material  New  chest pain or pain under the shoulder blades  Painful or persistently difficult swallowing  New shortness of breath  Fever of 100F or higher  Black, tarry-looking stools  For urgent or emergent issues, a gastroenterologist can be reached at any hour by calling [phone]. Do not use MyChart messaging for urgent concerns.    DIET:  We do recommend a small meal at first, but then you may proceed to your regular diet.  Drink plenty of fluids but you should avoid alcoholic beverages for 24 hours.  ACTIVITY:  You should plan to take it easy for the rest of today and you should NOT DRIVE or use heavy machinery until tomorrow (because of the sedation medicines used during the test).    FOLLOW UP: Our staff will call the number listed on your records the next business day following your procedure.  We will call around 7:15- 8:00 am to check on you and address any questions or concerns that you may have regarding the information given to you following your procedure. If we do not reach you, we will leave a message.     If any biopsies were taken you will be contacted by phone or by letter within the next 1-3 weeks.  Please call us  at [phone] if you have not heard about the biopsies in 3 weeks.    SIGNATURES/CONFIDENTIALITY: You and/or your care partner have signed paperwork which will be entered into your  electronic medical record.  These signatures attest to the fact that that the information above on your After Visit Summary has been reviewed and is understood.  Full responsibility of the confidentiality of this discharge information lies with you and/or your care-partner.

## 2024-10-27 NOTE — Progress Notes (Signed)
 History and Physical Interval Note:  10/27/2024 2:53 PM  Jeremiah Jefferson  has presented today for endoscopic procedure(s), with the diagnosis of  Encounter Diagnoses  Name Primary?   Iron deficiency anemia, unspecified iron deficiency anemia type Yes   Blood donor-caused iron deficiency anemia   .  The various methods of evaluation and treatment have been discussed with the patient and/or family. After consideration of risks, benefits and other options for treatment, the patient has consented to  the endoscopic procedure(s).   The patient's history has been reviewed, patient examined, no change in status, stable for endoscopic procedure(s).  I have reviewed the patient's chart and labs.  Questions were answered to the patient's satisfaction.     [name], MD, NOLIA

## 2024-10-27 NOTE — Progress Notes (Signed)
 Sedate, gd SR, tolerated procedure well, VSS, report to RN

## 2024-10-28 ENCOUNTER — Telehealth: Payer: Self-pay

## 2024-10-28 ENCOUNTER — Ambulatory Visit: Payer: Self-pay | Admitting: Internal Medicine

## 2024-10-28 NOTE — Telephone Encounter (Signed)
 Follow up call to pt, no answer.

## 2025-03-29 ENCOUNTER — Other Ambulatory Visit (HOSPITAL_BASED_OUTPATIENT_CLINIC_OR_DEPARTMENT_OTHER)
